# Patient Record
Sex: Female | Born: 1980 | Race: Black or African American | Hispanic: No | Marital: Married | Smoking: Never smoker
Health system: Southern US, Community
[De-identification: ages and names within clinical notes are randomized; demographics above are authoritative.]

## PROBLEM LIST (undated history)

## (undated) ENCOUNTER — Inpatient Hospital Stay (HOSPITAL_COMMUNITY): Payer: Self-pay

## (undated) DIAGNOSIS — R011 Cardiac murmur, unspecified: Secondary | ICD-10-CM

## (undated) DIAGNOSIS — S42309A Unspecified fracture of shaft of humerus, unspecified arm, initial encounter for closed fracture: Secondary | ICD-10-CM

## (undated) DIAGNOSIS — B019 Varicella without complication: Secondary | ICD-10-CM

## (undated) DIAGNOSIS — N83209 Unspecified ovarian cyst, unspecified side: Secondary | ICD-10-CM

## (undated) DIAGNOSIS — D649 Anemia, unspecified: Secondary | ICD-10-CM

## (undated) HISTORY — PX: OTHER SURGICAL HISTORY: SHX169

## (undated) HISTORY — DX: Varicella without complication: B01.9

## (undated) HISTORY — DX: Cardiac murmur, unspecified: R01.1

---

## 2005-02-03 ENCOUNTER — Ambulatory Visit: Payer: Self-pay | Admitting: Family Medicine

## 2005-02-21 ENCOUNTER — Other Ambulatory Visit: Admission: RE | Admit: 2005-02-21 | Discharge: 2005-02-21 | Payer: Self-pay | Admitting: Family Medicine

## 2005-02-21 ENCOUNTER — Ambulatory Visit: Payer: Self-pay | Admitting: Family Medicine

## 2006-07-09 ENCOUNTER — Ambulatory Visit (HOSPITAL_COMMUNITY): Admission: RE | Admit: 2006-07-09 | Discharge: 2006-07-09 | Payer: Self-pay | Admitting: Obstetrics & Gynecology

## 2006-10-28 ENCOUNTER — Ambulatory Visit (HOSPITAL_COMMUNITY): Admission: RE | Admit: 2006-10-28 | Discharge: 2006-10-28 | Payer: Self-pay | Admitting: Obstetrics

## 2006-12-06 ENCOUNTER — Inpatient Hospital Stay (HOSPITAL_COMMUNITY): Admission: AD | Admit: 2006-12-06 | Discharge: 2006-12-09 | Payer: Self-pay | Admitting: Obstetrics

## 2010-02-08 ENCOUNTER — Ambulatory Visit (HOSPITAL_COMMUNITY): Admission: RE | Admit: 2010-02-08 | Discharge: 2010-02-08 | Payer: Self-pay | Admitting: Obstetrics

## 2011-04-18 NOTE — H&P (Signed)
[name], [name]             ACCOUNT NO.:  [id number]   MEDICAL RECORD NO.:  [id number]          PATIENT TYPE:  INP   LOCATION:  9164                          FACILITY:  [facility]   PHYSICIAN:  Roseanna Rainbow, M.D.DATE OF BIRTH:  July 03, 1981   DATE OF ADMISSION:  12/06/2006  DATE OF DISCHARGE:                              HISTORY & PHYSICAL   CHIEF COMPLAINT:  The patient is a 30 year old gravida 1, para 0 with an  estimated date of confinement of December 02, 2006 with an intrauterine  pregnancy at 40+ weeks for induction of labor.   HISTORY OF PRESENT ILLNESS:  Please see the above.   ALLERGIES:  No known drug allergies.   MEDICATIONS:  Please see the reconciliation sheet.   RISK FACTORS:  1. GBS positive.  2. Rh negative   LABS:  Chlamydia probe negative. Urine culture and sensitivity - no uro  pathogens. One hour GCT 67. GC probe negative. Pap smear normal.  Hepatitis B. Surface antigen negative. Hematocrit 37. Hemoglobin 12.2,  HIV nonreactive. Platelets 223,000. Blood type is O negative, antibody  screen negative. RPR nonreactive. Rubella immune. Sickle cell negative.   PAST GYN HISTORY:  She has a history of an ovarian cystectomy.   PAST MEDICAL HISTORY:  Urinary tract infections.   PAST SURGICAL HISTORY:  Please see the above.   SOCIAL HISTORY:  She is a Child psychotherapist, single. Does not give any  significant history of alcohol usage. Has no significant smoking  history. Denies illicit drug use.   FAMILY HISTORY:  Noncontributory.   PHYSICAL EXAMINATION:  VITALS: Vital signs stable. Afebrile. Fetal heart  tracing reassuring.  GENERAL: No apparent distress. Well-developed.  ABDOMEN: Gravid, nontender.  GENITOURINARY: Cervix 1 in 50% as per the RN.   ASSESSMENT:  Intrauterine pregnancy at 40 weeks. Borderline Bishop  score. Fetal heart tracing consistent with fetal well-being. Rh  negative. GBS positive.   PLAN:  Admission.  Two stage induction of labor.  Will begin with  cervical ripening with Cervidil. GBS prophylaxis with penicillin per  protocol in labor.      Roseanna Rainbow, M.D.  Electronically Signed     LAJ/MEDQ  D:  12/07/2006  T:  12/07/2006  Job:  [id number]

## 2012-12-07 LAB — US OB COMP LESS 14 WKS

## 2012-12-08 LAB — OB RESULTS CONSOLE GC/CHLAMYDIA: Chlamydia: NEGATIVE

## 2012-12-08 LAB — OB RESULTS CONSOLE RUBELLA ANTIBODY, IGM: Rubella: IMMUNE

## 2012-12-08 LAB — OB RESULTS CONSOLE ABO/RH

## 2012-12-08 LAB — OB RESULTS CONSOLE HIV ANTIBODY (ROUTINE TESTING): HIV: NONREACTIVE

## 2012-12-08 LAB — OB RESULTS CONSOLE HGB/HCT, BLOOD: Hemoglobin: 12.6 g/dL

## 2012-12-08 LAB — OB RESULTS CONSOLE ANTIBODY SCREEN: Antibody Screen: NEGATIVE

## 2012-12-10 MED ORDER — RHO D IMMUNE GLOBULIN 1500 UNIT/2ML IJ SOLN: 300.0000 ug | Freq: Once | INTRAMUSCULAR | Status: AC

## 2013-01-21 ENCOUNTER — Encounter: Payer: Self-pay | Admitting: *Deleted

## 2013-02-15 ENCOUNTER — Ambulatory Visit: Payer: BC Managed Care – PPO | Admitting: *Deleted

## 2013-02-15 ENCOUNTER — Encounter: Payer: Self-pay | Admitting: Obstetrics

## 2013-02-15 ENCOUNTER — Ambulatory Visit (INDEPENDENT_AMBULATORY_CARE_PROVIDER_SITE_OTHER): Payer: BC Managed Care – PPO | Admitting: Obstetrics

## 2013-02-15 VITALS — BP 128/69 | Temp 97.0°F | Wt 243.0 lb

## 2013-02-15 DIAGNOSIS — Z3482 Encounter for supervision of other normal pregnancy, second trimester: Secondary | ICD-10-CM

## 2013-02-15 DIAGNOSIS — Z3480 Encounter for supervision of other normal pregnancy, unspecified trimester: Secondary | ICD-10-CM

## 2013-02-15 LAB — CBC
MCH: 27.5 pg (ref 26.0–34.0)
MCHC: 33.4 g/dL (ref 30.0–36.0)
MCV: 82.4 fL (ref 78.0–100.0)
RBC: 4.25 MIL/uL (ref 3.87–5.11)
RDW: 14.7 % (ref 11.5–15.5)
WBC: 12.3 10*3/uL — ABNORMAL HIGH (ref 4.0–10.5)

## 2013-02-15 LAB — POCT URINALYSIS DIPSTICK
Blood, UA: NEGATIVE
Glucose, UA: NEGATIVE
Spec Grav, UA: 1.01
Urobilinogen, UA: NEGATIVE
pH, UA: 8

## 2013-02-15 NOTE — Progress Notes (Signed)
Pt c/o intermittent lower back pain x 1 month (dull/achy). Pt c/o itchy rash on left side x 1 month.

## 2013-02-16 LAB — GLUCOSE TOLERANCE, 2 HOURS W/ 1HR: Glucose, 2 hour: 79 mg/dL (ref 70–139)

## 2013-03-16 ENCOUNTER — Ambulatory Visit (INDEPENDENT_AMBULATORY_CARE_PROVIDER_SITE_OTHER): Payer: BC Managed Care – PPO | Admitting: Obstetrics

## 2013-03-16 ENCOUNTER — Encounter: Payer: Self-pay | Admitting: Obstetrics

## 2013-03-16 VITALS — BP 120/77 | Temp 97.7°F | Wt 249.0 lb

## 2013-03-16 DIAGNOSIS — Z3480 Encounter for supervision of other normal pregnancy, unspecified trimester: Secondary | ICD-10-CM

## 2013-03-16 DIAGNOSIS — Z3483 Encounter for supervision of other normal pregnancy, third trimester: Secondary | ICD-10-CM

## 2013-03-16 DIAGNOSIS — K219 Gastro-esophageal reflux disease without esophagitis: Secondary | ICD-10-CM

## 2013-03-16 DIAGNOSIS — Z369 Encounter for antenatal screening, unspecified: Secondary | ICD-10-CM

## 2013-03-16 LAB — POCT URINALYSIS DIPSTICK
Glucose, UA: NEGATIVE
pH, UA: 5

## 2013-03-16 MED ORDER — OMEPRAZOLE 20 MG PO CPDR: 20.0000 mg | DELAYED_RELEASE_CAPSULE | Freq: Every day | ORAL | Status: DC

## 2013-03-16 NOTE — Progress Notes (Signed)
P 76 Pt reports she is doing well- pt states she quit using her PNV due to constipation.

## 2013-03-29 ENCOUNTER — Encounter: Payer: Self-pay | Admitting: Obstetrics

## 2013-03-29 ENCOUNTER — Ambulatory Visit (INDEPENDENT_AMBULATORY_CARE_PROVIDER_SITE_OTHER): Payer: BC Managed Care – PPO | Admitting: Obstetrics

## 2013-03-29 VITALS — BP 133/83 | Temp 97.5°F | Wt 249.4 lb

## 2013-03-29 DIAGNOSIS — Z2913 Encounter for prophylactic Rho(D) immune globulin: Secondary | ICD-10-CM

## 2013-03-29 DIAGNOSIS — Z3480 Encounter for supervision of other normal pregnancy, unspecified trimester: Secondary | ICD-10-CM

## 2013-03-29 DIAGNOSIS — Z298 Encounter for other specified prophylactic measures: Secondary | ICD-10-CM

## 2013-03-29 LAB — POCT URINALYSIS DIPSTICK
Bilirubin, UA: NEGATIVE
Glucose, UA: NEGATIVE
Ketones, UA: NEGATIVE
Protein, UA: NEGATIVE
Spec Grav, UA: 1.015
Urobilinogen, UA: NEGATIVE
pH, UA: 5

## 2013-03-29 MED ORDER — RHO D IMMUNE GLOBULIN 1500 UNIT/2ML IJ SOLN
300.0000 ug | Freq: Once | INTRAMUSCULAR | Status: AC
  Administered 2013-03-29: 300 ug via INTRAMUSCULAR

## 2013-03-29 NOTE — Progress Notes (Signed)
Pulse 75 patient c/o of intermittent bilateral abdominal pain.

## 2013-03-31 ENCOUNTER — Encounter: Payer: Self-pay | Admitting: Obstetrics

## 2013-04-06 ENCOUNTER — Encounter: Payer: Self-pay | Admitting: Obstetrics

## 2013-04-06 ENCOUNTER — Ambulatory Visit (INDEPENDENT_AMBULATORY_CARE_PROVIDER_SITE_OTHER): Payer: BC Managed Care – PPO

## 2013-04-06 DIAGNOSIS — Z369 Encounter for antenatal screening, unspecified: Secondary | ICD-10-CM

## 2013-04-06 DIAGNOSIS — O3660X0 Maternal care for excessive fetal growth, unspecified trimester, not applicable or unspecified: Secondary | ICD-10-CM

## 2013-04-06 LAB — US OB DETAIL + 14 WK

## 2013-04-07 ENCOUNTER — Encounter: Payer: Self-pay | Admitting: Obstetrics

## 2013-04-07 LAB — US OB DETAIL + 14 WK

## 2013-04-11 ENCOUNTER — Encounter: Payer: Self-pay | Admitting: *Deleted

## 2013-04-11 ENCOUNTER — Ambulatory Visit (INDEPENDENT_AMBULATORY_CARE_PROVIDER_SITE_OTHER): Payer: BC Managed Care – PPO | Admitting: Obstetrics

## 2013-04-11 VITALS — BP 127/81 | Temp 98.0°F | Wt 253.0 lb

## 2013-04-11 DIAGNOSIS — Z3480 Encounter for supervision of other normal pregnancy, unspecified trimester: Secondary | ICD-10-CM

## 2013-04-11 DIAGNOSIS — Z3483 Encounter for supervision of other normal pregnancy, third trimester: Secondary | ICD-10-CM

## 2013-04-11 LAB — POCT URINALYSIS DIPSTICK
Bilirubin, UA: NEGATIVE
Glucose, UA: NEGATIVE
Ketones, UA: NEGATIVE
Spec Grav, UA: 1.02

## 2013-04-11 NOTE — Progress Notes (Signed)
Pulse: 73

## 2013-04-12 ENCOUNTER — Encounter: Payer: Self-pay | Admitting: Obstetrics

## 2013-04-26 ENCOUNTER — Ambulatory Visit (INDEPENDENT_AMBULATORY_CARE_PROVIDER_SITE_OTHER): Payer: BC Managed Care – PPO | Admitting: Obstetrics

## 2013-04-26 VITALS — BP 134/82 | Temp 96.7°F | Wt 254.0 lb

## 2013-04-26 DIAGNOSIS — Z3480 Encounter for supervision of other normal pregnancy, unspecified trimester: Secondary | ICD-10-CM

## 2013-04-26 DIAGNOSIS — Z8719 Personal history of other diseases of the digestive system: Secondary | ICD-10-CM

## 2013-04-26 DIAGNOSIS — Z3482 Encounter for supervision of other normal pregnancy, second trimester: Secondary | ICD-10-CM

## 2013-04-26 LAB — POCT URINALYSIS DIPSTICK
Bilirubin, UA: NEGATIVE
Blood, UA: NEGATIVE
Glucose, UA: NEGATIVE
Urobilinogen, UA: NEGATIVE
pH, UA: 5

## 2013-04-26 MED ORDER — RANITIDINE HCL 150 MG PO TABS: 150.0000 mg | ORAL_TABLET | Freq: Two times a day (BID) | ORAL | Status: DC

## 2013-04-26 NOTE — Progress Notes (Signed)
Pulse: 78

## 2013-04-27 ENCOUNTER — Encounter: Payer: Self-pay | Admitting: Obstetrics

## 2013-05-06 ENCOUNTER — Inpatient Hospital Stay (HOSPITAL_COMMUNITY)
Admission: AD | Admit: 2013-05-06 | Discharge: 2013-05-06 | Disposition: A | Discharge status: Home / Self Care | Payer: Federal, State, Local not specified - PPO | Source: Ambulatory Visit | Attending: Obstetrics & Gynecology | Admitting: Obstetrics & Gynecology

## 2013-05-06 ENCOUNTER — Encounter (HOSPITAL_COMMUNITY): Payer: Self-pay | Admitting: *Deleted

## 2013-05-06 DIAGNOSIS — R109 Unspecified abdominal pain: Secondary | ICD-10-CM | POA: Insufficient documentation

## 2013-05-06 DIAGNOSIS — N949 Unspecified condition associated with female genital organs and menstrual cycle: Secondary | ICD-10-CM | POA: Insufficient documentation

## 2013-05-06 DIAGNOSIS — O26893 Other specified pregnancy related conditions, third trimester: Secondary | ICD-10-CM

## 2013-05-06 DIAGNOSIS — O26859 Spotting complicating pregnancy, unspecified trimester: Secondary | ICD-10-CM | POA: Insufficient documentation

## 2013-05-06 DIAGNOSIS — N888 Other specified noninflammatory disorders of cervix uteri: Secondary | ICD-10-CM

## 2013-05-06 DIAGNOSIS — N898 Other specified noninflammatory disorders of vagina: Secondary | ICD-10-CM

## 2013-05-06 HISTORY — DX: Unspecified fracture of shaft of humerus, unspecified arm, initial encounter for closed fracture: S42.309A

## 2013-05-06 HISTORY — DX: Unspecified ovarian cyst, unspecified side: N83.209

## 2013-05-06 LAB — WET PREP, GENITAL
Trich, Wet Prep: NONE SEEN
Yeast Wet Prep HPF POC: NONE SEEN

## 2013-05-06 NOTE — MAU Note (Signed)
Patient states she had dark pink spotting on the tissue after urinating this am and had a slight darker spotting this afternoon. No bleeding at this time and nothing on the pad, only with wiping. Denies pain and reports good fetal movement.

## 2013-05-06 NOTE — MAU Note (Addendum)
Pinkish blood noted when wiped this morning, dark red when saw it later, none since. Denies hx of low lying or placenta previa.

## 2013-05-06 NOTE — MAU Provider Note (Signed)
History     CSN: [id number]  Arrival date and time: 05/06/13 1540   First Provider Initiated Contact with Patient 05/06/13 1637      Chief Complaint  Patient presents with  . Vaginal Bleeding   HPI [name] is a 32 y.o. G2P1001 at [redacted]w[redacted]d who presents to MAU today with complaint of spotting with wiping x 2 today. The patient denies contractions, LOF, abdominal pain, UTI symptoms, fever, N/V/D or constipation. She denies recent intercourse or exam. She endorses a yellow-clear thin vaginal discharge without odor. She reports good fetal movement.   OB History   Grav Para Term Preterm Abortions TAB SAB Ect Mult Living   2 1 1       1       Past Medical History  Diagnosis Date  . Heart murmur   . Chicken pox chicken pox  . Ovarian cyst   . Fracture of arm     right    Past Surgical History  Procedure Laterality Date  . Ovarian cystectomy N/A     Family History  Problem Relation Age of Onset  . Hypertension      parents  . Cancer Neg Hx   . Stroke Neg Hx   . Diabetes Paternal Grandmother   . Hypertension Father   . Heart disease Father     History  Substance Use Topics  . Smoking status: Never Smoker   . Smokeless tobacco: Never Used  . Alcohol Use: No    Allergies: No Known Allergies  Prescriptions prior to admission  Medication Sig Dispense Refill  . ranitidine (ZANTAC) 150 MG tablet Take 1 tablet (150 mg total) by mouth 2 (two) times daily.  60 tablet  11    Review of Systems  Constitutional: Negative for fever and malaise/fatigue.  Gastrointestinal: Positive for nausea. Negative for vomiting, abdominal pain, diarrhea and constipation.  Genitourinary: Negative for dysuria, urgency and frequency.       + discharge, bleeding Neg - LOF   Physical Exam   Blood pressure 133/70, pulse 114, temperature 98.2 F (36.8 C), temperature source Oral, resp. rate 20, height 5\' 10"  (1.778 m), weight 255 lb 12.8 oz (116.03 kg), last menstrual period  09/11/2012, SpO2 100.00%.  Physical Exam  Constitutional: She is oriented to person, place, and time. She appears well-developed and well-nourished. No distress.  HENT:  Head: Normocephalic and atraumatic.  Cardiovascular: Normal rate, regular rhythm and normal heart sounds.   Respiratory: Effort normal and breath sounds normal. No respiratory distress.  GI: Soft. Bowel sounds are normal. She exhibits no distension and no mass. There is no tenderness. There is no rebound and no guarding.  Genitourinary: Uterus is enlarged (appropriate for GA). Uterus is not tender. Cervix exhibits friability (small area of bleeding on the cervix). Cervix exhibits no motion tenderness and no discharge. Vaginal discharge (small amount of thin, white, mucus discharge) found.  Neurological: She is alert and oriented to person, place, and time.  Skin: Skin is warm and dry. No erythema.  Psychiatric: She has a normal mood and affect.   Fetal monitoring: Baseline: 130 bpm, moderate variability, + accelerations, no decelerations Contractions: none  MAU Course  Procedures  MDM Cervix is closed Bleeding noted in area of friability on cervix, none at cervical os  Assessment and Plan  A: Cervical friability  P: Discharge home Patient encouraged to follow-up in office as scheduled Bleeding and labor precautions reviewed Patient may return to MAU as needed  [name]  Ethier, PA-C  05/06/2013, 4:37 PM

## 2013-05-07 LAB — GC/CHLAMYDIA PROBE AMP
CT Probe RNA: NEGATIVE
GC Probe RNA: NEGATIVE

## 2013-05-10 ENCOUNTER — Encounter: Payer: Self-pay | Admitting: Obstetrics

## 2013-05-10 ENCOUNTER — Ambulatory Visit (INDEPENDENT_AMBULATORY_CARE_PROVIDER_SITE_OTHER): Payer: BC Managed Care – PPO | Admitting: Obstetrics

## 2013-05-10 VITALS — BP 126/83 | Temp 98.0°F | Wt 255.4 lb

## 2013-05-10 DIAGNOSIS — Z3483 Encounter for supervision of other normal pregnancy, third trimester: Secondary | ICD-10-CM

## 2013-05-10 DIAGNOSIS — Z3480 Encounter for supervision of other normal pregnancy, unspecified trimester: Secondary | ICD-10-CM

## 2013-05-10 LAB — POCT URINALYSIS DIPSTICK
Ketones, UA: NEGATIVE
Nitrite, UA: NEGATIVE
Spec Grav, UA: 1.01
Urobilinogen, UA: NEGATIVE
pH, UA: 7.5

## 2013-05-10 NOTE — Progress Notes (Signed)
Pulse: 91

## 2013-05-10 NOTE — Addendum Note (Signed)
Addended by: George Hugh on: 05/10/2013 05:18 PM   Modules accepted: Orders

## 2013-05-12 LAB — STREP B DNA PROBE: GBSP: POSITIVE

## 2013-05-17 ENCOUNTER — Ambulatory Visit (INDEPENDENT_AMBULATORY_CARE_PROVIDER_SITE_OTHER): Payer: BC Managed Care – PPO | Admitting: Obstetrics

## 2013-05-17 ENCOUNTER — Encounter: Payer: Self-pay | Admitting: Obstetrics

## 2013-05-17 ENCOUNTER — Encounter: Payer: Self-pay | Admitting: *Deleted

## 2013-05-17 VITALS — BP 140/76 | Temp 97.8°F | Wt 254.8 lb

## 2013-05-17 DIAGNOSIS — Z3483 Encounter for supervision of other normal pregnancy, third trimester: Secondary | ICD-10-CM

## 2013-05-17 DIAGNOSIS — Z3480 Encounter for supervision of other normal pregnancy, unspecified trimester: Secondary | ICD-10-CM

## 2013-05-17 LAB — POCT URINALYSIS DIPSTICK
Bilirubin, UA: NEGATIVE
Ketones, UA: NEGATIVE
Protein, UA: NEGATIVE
pH, UA: 7

## 2013-05-17 NOTE — Progress Notes (Signed)
Pulse-84 Pt c/o left side abdominal cramping once x 2 days ago. Pt denies having that feeling since.

## 2013-05-24 ENCOUNTER — Ambulatory Visit (INDEPENDENT_AMBULATORY_CARE_PROVIDER_SITE_OTHER): Payer: BC Managed Care – PPO | Admitting: Obstetrics

## 2013-05-24 ENCOUNTER — Encounter: Payer: Self-pay | Admitting: Obstetrics

## 2013-05-24 VITALS — BP 137/83 | Temp 97.2°F | Wt 256.0 lb

## 2013-05-24 DIAGNOSIS — Z3483 Encounter for supervision of other normal pregnancy, third trimester: Secondary | ICD-10-CM

## 2013-05-24 DIAGNOSIS — Z3480 Encounter for supervision of other normal pregnancy, unspecified trimester: Secondary | ICD-10-CM

## 2013-05-24 LAB — POCT URINALYSIS DIPSTICK
Blood, UA: NEGATIVE
Glucose, UA: NEGATIVE
Ketones, UA: NEGATIVE
Nitrite, UA: NEGATIVE
Spec Grav, UA: 1.02
Urobilinogen, UA: NEGATIVE

## 2013-05-24 NOTE — Progress Notes (Signed)
Pulse-75 No complaints.  

## 2013-05-31 ENCOUNTER — Ambulatory Visit (INDEPENDENT_AMBULATORY_CARE_PROVIDER_SITE_OTHER): Payer: BC Managed Care – PPO | Admitting: Obstetrics

## 2013-05-31 ENCOUNTER — Encounter: Payer: Self-pay | Admitting: Obstetrics

## 2013-05-31 VITALS — BP 129/80 | Temp 98.6°F | Wt 257.6 lb

## 2013-05-31 DIAGNOSIS — Z3483 Encounter for supervision of other normal pregnancy, third trimester: Secondary | ICD-10-CM

## 2013-05-31 DIAGNOSIS — Z3480 Encounter for supervision of other normal pregnancy, unspecified trimester: Secondary | ICD-10-CM

## 2013-05-31 LAB — POCT URINALYSIS DIPSTICK: Ketones, UA: NEGATIVE

## 2013-05-31 NOTE — Progress Notes (Signed)
Pulse: 71

## 2013-06-07 ENCOUNTER — Encounter: Payer: Self-pay | Admitting: Obstetrics

## 2013-06-07 ENCOUNTER — Ambulatory Visit (INDEPENDENT_AMBULATORY_CARE_PROVIDER_SITE_OTHER): Payer: BC Managed Care – PPO | Admitting: Obstetrics

## 2013-06-07 VITALS — BP 111/75 | Wt 258.0 lb

## 2013-06-07 DIAGNOSIS — Z3480 Encounter for supervision of other normal pregnancy, unspecified trimester: Secondary | ICD-10-CM

## 2013-06-07 DIAGNOSIS — Z3483 Encounter for supervision of other normal pregnancy, third trimester: Secondary | ICD-10-CM

## 2013-06-07 LAB — POCT URINALYSIS DIPSTICK
Blood, UA: NEGATIVE
Glucose, UA: NEGATIVE
Ketones, UA: NEGATIVE
Protein, UA: NEGATIVE
Spec Grav, UA: 1.015
Urobilinogen, UA: NEGATIVE
pH, UA: 6

## 2013-06-07 NOTE — Progress Notes (Signed)
Pulse- 103 Pt states she is having lower abdomen pressure

## 2013-06-14 ENCOUNTER — Inpatient Hospital Stay (HOSPITAL_COMMUNITY)
Admission: RE | Admit: 2013-06-14 | Discharge: 2013-06-19 | DRG: 370 | Disposition: A | Discharge status: Home / Self Care | Payer: Federal, State, Local not specified - PPO | Source: Ambulatory Visit | Attending: Obstetrics | Admitting: Obstetrics

## 2013-06-14 ENCOUNTER — Encounter (HOSPITAL_COMMUNITY): Payer: Self-pay | Admitting: *Deleted

## 2013-06-14 ENCOUNTER — Telehealth (HOSPITAL_COMMUNITY): Payer: Self-pay | Admitting: *Deleted

## 2013-06-14 ENCOUNTER — Encounter (HOSPITAL_COMMUNITY): Payer: Self-pay

## 2013-06-14 ENCOUNTER — Ambulatory Visit (INDEPENDENT_AMBULATORY_CARE_PROVIDER_SITE_OTHER): Payer: BC Managed Care – PPO | Admitting: Obstetrics

## 2013-06-14 VITALS — BP 123/78 | HR 92 | Temp 97.9°F | Resp 18 | Ht 70.0 in | Wt 258.0 lb

## 2013-06-14 VITALS — BP 132/79 | Temp 98.2°F | Wt 258.6 lb

## 2013-06-14 DIAGNOSIS — Z302 Encounter for sterilization: Secondary | ICD-10-CM

## 2013-06-14 DIAGNOSIS — Z3483 Encounter for supervision of other normal pregnancy, third trimester: Secondary | ICD-10-CM

## 2013-06-14 DIAGNOSIS — Z98891 History of uterine scar from previous surgery: Secondary | ICD-10-CM

## 2013-06-14 DIAGNOSIS — D689 Coagulation defect, unspecified: Secondary | ICD-10-CM | POA: Diagnosis present

## 2013-06-14 DIAGNOSIS — O9912 Other diseases of the blood and blood-forming organs and certain disorders involving the immune mechanism complicating childbirth: Secondary | ICD-10-CM | POA: Diagnosis present

## 2013-06-14 DIAGNOSIS — D649 Anemia, unspecified: Secondary | ICD-10-CM | POA: Diagnosis not present

## 2013-06-14 DIAGNOSIS — Z22330 Carrier of Group B streptococcus: Secondary | ICD-10-CM

## 2013-06-14 DIAGNOSIS — O480 Post-term pregnancy: Secondary | ICD-10-CM

## 2013-06-14 DIAGNOSIS — Z3480 Encounter for supervision of other normal pregnancy, unspecified trimester: Secondary | ICD-10-CM

## 2013-06-14 DIAGNOSIS — O99892 Other specified diseases and conditions complicating childbirth: Secondary | ICD-10-CM | POA: Diagnosis present

## 2013-06-14 DIAGNOSIS — O9903 Anemia complicating the puerperium: Secondary | ICD-10-CM | POA: Diagnosis not present

## 2013-06-14 DIAGNOSIS — D696 Thrombocytopenia, unspecified: Secondary | ICD-10-CM | POA: Diagnosis present

## 2013-06-14 LAB — CBC
HCT: 32.9 % — ABNORMAL LOW (ref 36.0–46.0)
MCV: 83.7 fL (ref 78.0–100.0)
Platelets: 131 10*3/uL — ABNORMAL LOW (ref 150–400)
RBC: 3.93 MIL/uL (ref 3.87–5.11)
RDW: 14.5 % (ref 11.5–15.5)
WBC: 15.1 10*3/uL — ABNORMAL HIGH (ref 4.0–10.5)

## 2013-06-14 LAB — POCT URINALYSIS DIPSTICK
Bilirubin, UA: NEGATIVE
Glucose, UA: NEGATIVE
Leukocytes, UA: NEGATIVE
Nitrite, UA: NEGATIVE
Urobilinogen, UA: NEGATIVE

## 2013-06-14 LAB — TYPE AND SCREEN
ABO/RH(D): O NEG
Antibody Screen: NEGATIVE

## 2013-06-14 MED ORDER — PENICILLIN G POTASSIUM 5000000 UNITS IJ SOLR
2.5000 10*6.[IU] | INTRAVENOUS | Status: DC
  Administered 2013-06-15 – 2013-06-16 (×9): 2.5 10*6.[IU] via INTRAVENOUS
  Filled 2013-06-14 (×13): qty 2.5

## 2013-06-14 MED ORDER — LACTATED RINGERS IV SOLN: 500.0000 mL | INTRAVENOUS | Status: DC | PRN

## 2013-06-14 MED ORDER — MISOPROSTOL 25 MCG QUARTER TABLET
25.0000 ug | ORAL_TABLET | ORAL | Status: DC | PRN
  Administered 2013-06-14 – 2013-06-15 (×2): 25 ug via VAGINAL
  Filled 2013-06-14 (×2): qty 0.25

## 2013-06-14 MED ORDER — CITRIC ACID-SODIUM CITRATE 334-500 MG/5ML PO SOLN
30.0000 mL | ORAL | Status: DC | PRN
  Administered 2013-06-16: 30 mL via ORAL
  Filled 2013-06-14: qty 15

## 2013-06-14 MED ORDER — OXYCODONE-ACETAMINOPHEN 5-325 MG PO TABS: 1.0000 | ORAL_TABLET | ORAL | Status: DC | PRN

## 2013-06-14 MED ORDER — OXYTOCIN 40 UNITS IN LACTATED RINGERS INFUSION - SIMPLE MED: 62.5000 mL/h | INTRAVENOUS | Status: DC

## 2013-06-14 MED ORDER — LIDOCAINE HCL (PF) 1 % IJ SOLN: 30.0000 mL | INTRAMUSCULAR | Status: DC | PRN

## 2013-06-14 MED ORDER — OXYTOCIN BOLUS FROM INFUSION: 500.0000 mL | INTRAVENOUS | Status: DC

## 2013-06-14 MED ORDER — PENICILLIN G POTASSIUM 5000000 UNITS IJ SOLR
5.0000 10*6.[IU] | Freq: Once | INTRAVENOUS | Status: AC
  Administered 2013-06-15: 5 10*6.[IU] via INTRAVENOUS
  Filled 2013-06-14: qty 5

## 2013-06-14 MED ORDER — ACETAMINOPHEN 325 MG PO TABS: 650.0000 mg | ORAL_TABLET | ORAL | Status: DC | PRN

## 2013-06-14 MED ORDER — NALBUPHINE HCL 10 MG/ML IJ SOLN
10.0000 mg | Freq: Four times a day (QID) | INTRAMUSCULAR | Status: DC | PRN
  Administered 2013-06-15: 10 mg via INTRAMUSCULAR
  Filled 2013-06-14: qty 1

## 2013-06-14 MED ORDER — ONDANSETRON HCL 4 MG/2ML IJ SOLN: 4.0000 mg | Freq: Four times a day (QID) | INTRAMUSCULAR | Status: DC | PRN

## 2013-06-14 MED ORDER — IBUPROFEN 600 MG PO TABS: 600.0000 mg | ORAL_TABLET | Freq: Four times a day (QID) | ORAL | Status: DC | PRN

## 2013-06-14 MED ORDER — NALBUPHINE SYRINGE 5 MG/0.5 ML
10.0000 mg | INJECTION | INTRAMUSCULAR | Status: DC | PRN
  Administered 2013-06-15 (×2): 10 mg via INTRAVENOUS
  Filled 2013-06-14 (×3): qty 1

## 2013-06-14 MED ORDER — LACTATED RINGERS IV SOLN
INTRAVENOUS | Status: DC
  Administered 2013-06-14 – 2013-06-16 (×7): via INTRAVENOUS

## 2013-06-14 MED ORDER — PROMETHAZINE HCL 25 MG/ML IJ SOLN
25.0000 mg | Freq: Four times a day (QID) | INTRAMUSCULAR | Status: DC | PRN
  Administered 2013-06-15 (×2): 25 mg via INTRAMUSCULAR
  Filled 2013-06-14 (×2): qty 1

## 2013-06-14 MED ORDER — TERBUTALINE SULFATE 1 MG/ML IJ SOLN: 0.2500 mg | Freq: Once | INTRAMUSCULAR | Status: AC | PRN

## 2013-06-14 NOTE — Telephone Encounter (Signed)
Preadmission screen  

## 2013-06-14 NOTE — H&P (Signed)
Melanie [name] is a 32 y.o. female presenting for IOL. Maternal Medical History:  Reason for admission: 19 y o G2 P1.  EDC 06-13-13.  Presents for IOL for postdates.  Fetal activity: Perceived fetal activity is normal.   Last perceived fetal movement was within the past hour.    Prenatal complications: no prenatal complications   OB History   Grav Para Term Preterm Abortions TAB SAB Ect Mult Living   2 1 1       1      Past Medical History  Diagnosis Date  . Heart murmur   . Chicken pox chicken pox  . Ovarian cyst   . Fracture of arm     right   Past Surgical History  Procedure Laterality Date  . Ovarian cystectomy N/A    Family History: family history includes Diabetes in her paternal grandmother and Hypertension in her father.  There is no history of Cancer and Stroke. Social History:  reports that she has never smoked. She has never used smokeless tobacco. She reports that she does not drink alcohol or use illicit drugs.   Prenatal Transfer Tool  Maternal Diabetes: No Genetic Screening: Normal Maternal Ultrasounds/Referrals: Normal Fetal Ultrasounds or other Referrals:  None Maternal Substance Abuse:  No Significant Maternal Medications:  None Significant Maternal Lab Results:  None Other Comments:  None  Review of Systems  All other systems reviewed and are negative.      Blood pressure 127/74, pulse 87, temperature 97.9 F (36.6 C), temperature source Oral, resp. rate 20, height 5\' 10"  (1.778 m), weight 258 lb (117.028 kg), last menstrual period 09/11/2012. Maternal Exam:  Abdomen: Patient reports no abdominal tenderness. Fetal presentation: vertex  Pelvis: adequate for delivery.   Cervix: Cervix evaluated by digital exam.     Physical Exam  Nursing note and vitals reviewed. Constitutional: She is oriented to person, place, and time. She appears well-developed and well-nourished.  HENT:  Head: Normocephalic and atraumatic.  Eyes: Conjunctivae are  normal. Pupils are equal, round, and reactive to light.  Neck: Normal range of motion. Neck supple.  Cardiovascular: Normal rate and regular rhythm.   Respiratory: Effort normal and breath sounds normal.  GI: Soft.  Genitourinary: Vagina normal and uterus normal.  Musculoskeletal: Normal range of motion.  Neurological: She is alert and oriented to person, place, and time.  Skin: Skin is warm and dry.  Psychiatric: She has a normal mood and affect. Her behavior is normal. Judgment and thought content normal.    Prenatal labs: ABO, Rh: O/Negative/-- (01/08 0000) Antibody: Negative (01/08 0000) Rubella: Immune (01/08 0000) RPR: NON REAC (03/18 0948)  HBsAg: Negative (01/08 0000)  HIV: NON REACTIVE (03/18 0948)  GBS: POSITIVE (06/10 1720)   Assessment/Plan: Postdates.  2 stage IOL.   Sharnell Knight A 06/14/2013, 8:30 PM

## 2013-06-14 NOTE — Progress Notes (Signed)
Pulse: 90

## 2013-06-15 ENCOUNTER — Inpatient Hospital Stay (HOSPITAL_COMMUNITY): Payer: Federal, State, Local not specified - PPO | Admitting: Anesthesiology

## 2013-06-15 ENCOUNTER — Encounter (HOSPITAL_COMMUNITY): Payer: Self-pay | Admitting: Anesthesiology

## 2013-06-15 LAB — CBC
HCT: 33.7 % — ABNORMAL LOW (ref 36.0–46.0)
Hemoglobin: 10.8 g/dL — ABNORMAL LOW (ref 12.0–15.0)
MCHC: 32 g/dL (ref 30.0–36.0)
MCV: 84.7 fL (ref 78.0–100.0)

## 2013-06-15 MED ORDER — EPHEDRINE 5 MG/ML INJ: 10.0000 mg | INTRAVENOUS | Status: DC | PRN

## 2013-06-15 MED ORDER — MISOPROSTOL 25 MCG QUARTER TABLET
25.0000 ug | ORAL_TABLET | ORAL | Status: DC | PRN
  Administered 2013-06-15 – 2013-06-16 (×3): 25 ug via VAGINAL
  Filled 2013-06-15 (×3): qty 0.25

## 2013-06-15 MED ORDER — DIPHENHYDRAMINE HCL 50 MG/ML IJ SOLN: 12.5000 mg | INTRAMUSCULAR | Status: DC | PRN

## 2013-06-15 MED ORDER — TERBUTALINE SULFATE 1 MG/ML IJ SOLN: 0.2500 mg | Freq: Once | INTRAMUSCULAR | Status: AC | PRN

## 2013-06-15 MED ORDER — PHENYLEPHRINE 40 MCG/ML (10ML) SYRINGE FOR IV PUSH (FOR BLOOD PRESSURE SUPPORT): 80.0000 ug | PREFILLED_SYRINGE | INTRAVENOUS | Status: DC | PRN

## 2013-06-15 MED ORDER — LACTATED RINGERS IV SOLN: 500.0000 mL | Freq: Once | INTRAVENOUS | Status: DC

## 2013-06-15 MED ORDER — OXYTOCIN 40 UNITS IN LACTATED RINGERS INFUSION - SIMPLE MED
1.0000 m[IU]/min | INTRAVENOUS | Status: DC
  Administered 2013-06-16: 1 m[IU]/min via INTRAVENOUS
  Filled 2013-06-15: qty 1000

## 2013-06-15 MED ORDER — NALBUPHINE SYRINGE 5 MG/0.5 ML
10.0000 mg | INJECTION | Freq: Four times a day (QID) | INTRAMUSCULAR | Status: DC | PRN
  Administered 2013-06-15: 10 mg via INTRAMUSCULAR
  Filled 2013-06-15 (×2): qty 1

## 2013-06-15 MED ORDER — FENTANYL 2.5 MCG/ML BUPIVACAINE 1/10 % EPIDURAL INFUSION (WH - ANES): 14.0000 mL/h | INTRAMUSCULAR | Status: DC | PRN

## 2013-06-15 MED ORDER — OXYTOCIN 40 UNITS IN LACTATED RINGERS INFUSION - SIMPLE MED
1.0000 m[IU]/min | INTRAVENOUS | Status: DC
  Administered 2013-06-15: 1 m[IU]/min via INTRAVENOUS
  Administered 2013-06-15: 5 m[IU]/min via INTRAVENOUS
  Administered 2013-06-15: 4 m[IU]/min via INTRAVENOUS
  Filled 2013-06-15: qty 1000

## 2013-06-15 MED ORDER — LIDOCAINE HCL (PF) 1 % IJ SOLN
INTRAMUSCULAR | Status: DC | PRN
  Administered 2013-06-15: 3 mL
  Administered 2013-06-15: 5 mL

## 2013-06-15 NOTE — Anesthesia Procedure Notes (Signed)
Epidural Patient location during procedure: OB  Staffing Anesthesiologist: Phillips Grout Performed by: anesthesiologist   Preanesthetic Checklist Completed: patient identified, site marked, surgical consent, pre-op evaluation, timeout performed, IV checked, risks and benefits discussed and monitors and equipment checked  Epidural Patient position: sitting Prep: ChloraPrep Patient monitoring: heart rate, continuous pulse ox and blood pressure Approach: midline Injection technique: LOR saline  Needle:  Needle type: Tuohy  Needle gauge: 17 G Needle length: 9 cm and 9 Needle insertion depth: 9 cm Catheter type: closed end flexible Catheter size: 20 Guage Catheter at skin depth: 15 cm Test dose: negative  Assessment Events: blood not aspirated, injection not painful, no injection resistance, negative IV test and no paresthesia  Additional Notes   Patient tolerated the insertion well without complications.

## 2013-06-15 NOTE — Progress Notes (Signed)
Melanie [name] is a 32 y.o. G2P1001 at [redacted]w[redacted]d by LMP admitted for induction of labor due to Post dates. Due date 06-13-13.  Subjective:   Objective: BP 120/73  Pulse 79  Temp(Src) 98.1 F (36.7 C) (Oral)  Resp 18  Ht 5\' 10"  (1.778 m)  Wt 258 lb (117.028 kg)  BMI 37.02 kg/m2  LMP 09/11/2012      FHT:  FHR: 150 bpm, variability: moderate,  accelerations:  Present,  decelerations:  Absent UC:   regular, every 2-4 minutes SVE:   Dilation: 4 Effacement (%): 60 Station: -3 Exam by:: ansah-mensah, rnc  Labs: Lab Results  Component Value Date   WBC 13.1* 06/15/2013   HGB 10.8* 06/15/2013   HCT 33.7* 06/15/2013   MCV 84.7 06/15/2013   PLT 111* 06/15/2013    Assessment / Plan: IOL due to postdates.  No progress after 12 hours of pitocin.  Will stop pitocin and ripen cervix with Cytotec overnight. Thrombocytopenia.  Probable gestational.  Will place dry epidural for later use per recommendation of anesthesia.  Labor: Latent phase Preeclampsia:  n/a Fetal Wellbeing:  Category I Pain Control:  Nubain I/D:  n/a Anticipated MOD:  NSVD  HARPER,CHARLES A 06/15/2013, 8:01 PM

## 2013-06-15 NOTE — Anesthesia Preprocedure Evaluation (Signed)
Anesthesia Evaluation  Patient identified by MRN, date of birth, ID band Patient awake    Reviewed: Allergy & Precautions, H&P , NPO status , Patient's Chart, lab work & pertinent test results  History of Anesthesia Complications Negative for: history of anesthetic complications  Airway Mallampati: II TM Distance: >3 FB Neck ROM: full    Dental no notable dental hx. (+) Teeth Intact   Pulmonary neg pulmonary ROS,  breath sounds clear to auscultation  Pulmonary exam normal       Cardiovascular negative cardio ROS  Rhythm:regular Rate:Normal     Neuro/Psych negative neurological ROS  negative psych ROS   GI/Hepatic negative GI ROS, Neg liver ROS,   Endo/Other  negative endocrine ROS  Renal/GU negative Renal ROS  negative genitourinary   Musculoskeletal   Abdominal Normal abdominal exam  (+)   Peds  Hematology negative hematology ROS (+)   Anesthesia Other Findings   Reproductive/Obstetrics (+) Pregnancy                           Anesthesia Physical Anesthesia Plan  ASA: II  Anesthesia Plan: Epidural   Post-op Pain Management:    Induction:   Airway Management Planned:   Additional Equipment:   Intra-op Plan:   Post-operative Plan:   Informed Consent: I have reviewed the patients History and Physical, chart, labs and discussed the procedure including the risks, benefits and alternatives for the proposed anesthesia with the patient or authorized representative who has indicated his/her understanding and acceptance.     Plan Discussed with:   Anesthesia Plan Comments: (Asked to place dry catheter. PLT dropping. cytotec induction.)        Anesthesia Quick Evaluation

## 2013-06-16 ENCOUNTER — Encounter (HOSPITAL_COMMUNITY)
Admission: RE | Disposition: A | Discharge status: Home / Self Care | Payer: Self-pay | Source: Ambulatory Visit | Attending: Obstetrics

## 2013-06-16 ENCOUNTER — Encounter (HOSPITAL_COMMUNITY): Payer: Self-pay

## 2013-06-16 DIAGNOSIS — Z302 Encounter for sterilization: Secondary | ICD-10-CM

## 2013-06-16 LAB — CBC
HCT: 32.5 % — ABNORMAL LOW (ref 36.0–46.0)
HCT: 29.4 % — ABNORMAL LOW (ref 36.0–46.0)
Hemoglobin: 9.5 g/dL — ABNORMAL LOW (ref 12.0–15.0)
MCH: 27.4 pg (ref 26.0–34.0)
MCV: 84.9 fL (ref 78.0–100.0)
RBC: 3.5 MIL/uL — ABNORMAL LOW (ref 3.87–5.11)
RDW: 14.5 % (ref 11.5–15.5)
RDW: 14.5 % (ref 11.5–15.5)
WBC: 12.5 10*3/uL — ABNORMAL HIGH (ref 4.0–10.5)
WBC: 11 10*3/uL — ABNORMAL HIGH (ref 4.0–10.5)

## 2013-06-16 LAB — COMPREHENSIVE METABOLIC PANEL
Albumin: 2.3 g/dL — ABNORMAL LOW (ref 3.5–5.2)
BUN: 5 mg/dL — ABNORMAL LOW (ref 6–23)
CO2: 24 mEq/L (ref 19–32)
Calcium: 9 mg/dL (ref 8.4–10.5)
Chloride: 104 mEq/L (ref 96–112)
Creatinine, Ser: 0.89 mg/dL (ref 0.50–1.10)
GFR calc non Af Amer: 85 mL/min — ABNORMAL LOW (ref >90)
Total Bilirubin: 0.4 mg/dL (ref 0.3–1.2)

## 2013-06-16 SURGERY — Surgical Case
Anesthesia: Regional | Site: Abdomen | Wound class: Clean Contaminated

## 2013-06-16 MED ORDER — ACETAMINOPHEN 10 MG/ML IV SOLN: 1000.0000 mg | Freq: Once | INTRAVENOUS | Status: DC | PRN

## 2013-06-16 MED ORDER — MEPERIDINE HCL 25 MG/ML IJ SOLN
INTRAMUSCULAR | Status: AC
  Filled 2013-06-16: qty 1

## 2013-06-16 MED ORDER — KETOROLAC TROMETHAMINE 30 MG/ML IJ SOLN: 30.0000 mg | Freq: Four times a day (QID) | INTRAMUSCULAR | Status: DC | PRN

## 2013-06-16 MED ORDER — METOCLOPRAMIDE HCL 5 MG/ML IJ SOLN: 10.0000 mg | Freq: Three times a day (TID) | INTRAMUSCULAR | Status: DC | PRN

## 2013-06-16 MED ORDER — FENTANYL CITRATE 0.05 MG/ML IJ SOLN
INTRAMUSCULAR | Status: DC | PRN
  Administered 2013-06-16 (×4): 25 ug via INTRAVENOUS

## 2013-06-16 MED ORDER — ONDANSETRON HCL 4 MG/2ML IJ SOLN
INTRAMUSCULAR | Status: AC
  Filled 2013-06-16: qty 2

## 2013-06-16 MED ORDER — DIPHENHYDRAMINE HCL 50 MG/ML IJ SOLN: 25.0000 mg | INTRAMUSCULAR | Status: DC | PRN

## 2013-06-16 MED ORDER — ONDANSETRON HCL 4 MG/2ML IJ SOLN
INTRAMUSCULAR | Status: DC | PRN
  Administered 2013-06-16: 4 mg via INTRAVENOUS

## 2013-06-16 MED ORDER — KETOROLAC TROMETHAMINE 30 MG/ML IJ SOLN
30.0000 mg | Freq: Four times a day (QID) | INTRAMUSCULAR | Status: DC | PRN
  Administered 2013-06-17: 30 mg via INTRAVENOUS
  Filled 2013-06-16: qty 1

## 2013-06-16 MED ORDER — PROMETHAZINE HCL 25 MG/ML IJ SOLN: 6.2500 mg | INTRAMUSCULAR | Status: DC | PRN

## 2013-06-16 MED ORDER — KETOROLAC TROMETHAMINE 60 MG/2ML IM SOLN
60.0000 mg | Freq: Once | INTRAMUSCULAR | Status: AC | PRN
  Filled 2013-06-16: qty 2

## 2013-06-16 MED ORDER — MEPERIDINE HCL 25 MG/ML IJ SOLN
6.2500 mg | INTRAMUSCULAR | Status: DC | PRN
  Administered 2013-06-16: 6.25 mg via INTRAVENOUS

## 2013-06-16 MED ORDER — LACTATED RINGERS IV SOLN: 500.0000 mL | Freq: Once | INTRAVENOUS | Status: DC

## 2013-06-16 MED ORDER — OXYTOCIN 40 UNITS IN LACTATED RINGERS INFUSION - SIMPLE MED: 1.0000 m[IU]/min | INTRAVENOUS | Status: DC

## 2013-06-16 MED ORDER — HYDROMORPHONE HCL PF 1 MG/ML IJ SOLN
INTRAMUSCULAR | Status: AC
  Filled 2013-06-16: qty 1

## 2013-06-16 MED ORDER — OXYTOCIN 10 UNIT/ML IJ SOLN
INTRAMUSCULAR | Status: AC
  Filled 2013-06-16: qty 4

## 2013-06-16 MED ORDER — SODIUM CHLORIDE 0.9 % IJ SOLN: 3.0000 mL | INTRAMUSCULAR | Status: DC | PRN

## 2013-06-16 MED ORDER — PHENYLEPHRINE 40 MCG/ML (10ML) SYRINGE FOR IV PUSH (FOR BLOOD PRESSURE SUPPORT)
PREFILLED_SYRINGE | INTRAVENOUS | Status: AC
  Filled 2013-06-16: qty 5

## 2013-06-16 MED ORDER — NALOXONE HCL 0.4 MG/ML IJ SOLN: 0.4000 mg | INTRAMUSCULAR | Status: DC | PRN

## 2013-06-16 MED ORDER — OXYCODONE-ACETAMINOPHEN 5-325 MG PO TABS
1.0000 | ORAL_TABLET | ORAL | Status: DC | PRN
  Administered 2013-06-17: 1 via ORAL
  Filled 2013-06-16: qty 1

## 2013-06-16 MED ORDER — PHENYLEPHRINE HCL 10 MG/ML IJ SOLN
INTRAMUSCULAR | Status: DC | PRN
  Administered 2013-06-16: 40 ug via INTRAVENOUS
  Administered 2013-06-16 (×2): 80 ug via INTRAVENOUS

## 2013-06-16 MED ORDER — HYDROMORPHONE HCL PF 1 MG/ML IJ SOLN
0.2500 mg | INTRAMUSCULAR | Status: DC | PRN
  Administered 2013-06-16: 0.5 mg via INTRAVENOUS

## 2013-06-16 MED ORDER — MORPHINE SULFATE 0.5 MG/ML IJ SOLN
INTRAMUSCULAR | Status: AC
  Filled 2013-06-16: qty 10

## 2013-06-16 MED ORDER — FENTANYL CITRATE 0.05 MG/ML IJ SOLN
INTRAMUSCULAR | Status: AC
  Filled 2013-06-16: qty 2

## 2013-06-16 MED ORDER — NALBUPHINE HCL 10 MG/ML IJ SOLN
5.0000 mg | INTRAMUSCULAR | Status: DC | PRN
  Filled 2013-06-16: qty 1

## 2013-06-16 MED ORDER — OXYTOCIN 10 UNIT/ML IJ SOLN
40.0000 [IU] | INTRAVENOUS | Status: DC | PRN
  Administered 2013-06-16: 40 [IU] via INTRAVENOUS

## 2013-06-16 MED ORDER — MEPERIDINE HCL 25 MG/ML IJ SOLN: 6.2500 mg | INTRAMUSCULAR | Status: DC | PRN

## 2013-06-16 MED ORDER — CHLOROPROCAINE HCL 3 % IJ SOLN
INTRAMUSCULAR | Status: AC
  Filled 2013-06-16: qty 20

## 2013-06-16 MED ORDER — MORPHINE SULFATE (PF) 0.5 MG/ML IJ SOLN
INTRAMUSCULAR | Status: DC | PRN
  Administered 2013-06-16: 3 mg via INTRATHECAL
  Administered 2013-06-16 (×2): 1 mg via INTRAVENOUS

## 2013-06-16 MED ORDER — LACTATED RINGERS IV SOLN
INTRAVENOUS | Status: DC | PRN
  Administered 2013-06-16 (×2): via INTRAVENOUS

## 2013-06-16 MED ORDER — NALOXONE HCL 1 MG/ML IJ SOLN: 1.0000 ug/kg/h | INTRAMUSCULAR | Status: DC | PRN

## 2013-06-16 MED ORDER — FENTANYL 2.5 MCG/ML BUPIVACAINE 1/10 % EPIDURAL INFUSION (WH - ANES): INTRAMUSCULAR | Status: DC | PRN

## 2013-06-16 MED ORDER — KETOROLAC TROMETHAMINE 30 MG/ML IJ SOLN
INTRAMUSCULAR | Status: AC
  Administered 2013-06-16: 30 mg via INTRAVENOUS
  Filled 2013-06-16: qty 1

## 2013-06-16 MED ORDER — DIPHENHYDRAMINE HCL 50 MG/ML IJ SOLN: 12.5000 mg | INTRAMUSCULAR | Status: DC | PRN

## 2013-06-16 MED ORDER — SODIUM BICARBONATE 8.4 % IV SOLN
INTRAVENOUS | Status: DC | PRN
  Administered 2013-06-16: 2 mL via EPIDURAL

## 2013-06-16 MED ORDER — IBUPROFEN 800 MG PO TABS
800.0000 mg | ORAL_TABLET | Freq: Three times a day (TID) | ORAL | Status: DC | PRN
  Administered 2013-06-17: 800 mg via ORAL
  Filled 2013-06-16: qty 1

## 2013-06-16 MED ORDER — ONDANSETRON HCL 4 MG/2ML IJ SOLN: 4.0000 mg | Freq: Three times a day (TID) | INTRAMUSCULAR | Status: DC | PRN

## 2013-06-16 MED ORDER — DIPHENHYDRAMINE HCL 25 MG PO CAPS: 25.0000 mg | ORAL_CAPSULE | ORAL | Status: DC | PRN

## 2013-06-16 SURGICAL SUPPLY — 45 items
ADH SKN CLS APL DERMABOND .7 (GAUZE/BANDAGES/DRESSINGS) ×1
CANISTER WOUND CARE 500ML ATS (WOUND CARE) IMPLANT
CLAMP CORD UMBIL (MISCELLANEOUS) IMPLANT
CLOTH BEACON ORANGE TIMEOUT ST (SAFETY) ×2 IMPLANT
CONTAINER PREFILL 10% NBF 15ML (MISCELLANEOUS) ×4 IMPLANT
DERMABOND ADVANCED (GAUZE/BANDAGES/DRESSINGS) ×1
DERMABOND ADVANCED .7 DNX12 (GAUZE/BANDAGES/DRESSINGS) ×1 IMPLANT
DRAPE LG THREE QUARTER DISP (DRAPES) ×2 IMPLANT
DRSG OPSITE POSTOP 4X10 (GAUZE/BANDAGES/DRESSINGS) ×2 IMPLANT
DRSG VAC ATS LRG SENSATRAC (GAUZE/BANDAGES/DRESSINGS) IMPLANT
DRSG VAC ATS MED SENSATRAC (GAUZE/BANDAGES/DRESSINGS) IMPLANT
DRSG VAC ATS SM SENSATRAC (GAUZE/BANDAGES/DRESSINGS) IMPLANT
DURAPREP 26ML APPLICATOR (WOUND CARE) ×2 IMPLANT
ELECT REM PT RETURN 9FT ADLT (ELECTROSURGICAL) ×2
ELECTRODE REM PT RTRN 9FT ADLT (ELECTROSURGICAL) ×1 IMPLANT
EXTRACTOR VACUUM M CUP 4 TUBE (SUCTIONS) IMPLANT
GLOVE BIO SURGEON STRL SZ8 (GLOVE) ×4 IMPLANT
GLOVE SS N UNI LF 7.0 STRL (GLOVE) ×2 IMPLANT
GLOVE SURG SS PI 6.5 STRL IVOR (GLOVE) ×1 IMPLANT
GOWN PREVENTION PLUS XLARGE (GOWN DISPOSABLE) ×2 IMPLANT
GOWN STRL REIN XL XLG (GOWN DISPOSABLE) ×4 IMPLANT
KIT ABG SYR 3ML LUER SLIP (SYRINGE) IMPLANT
NDL HYPO 25X5/8 SAFETYGLIDE (NEEDLE) ×1 IMPLANT
NEEDLE HYPO 25X5/8 SAFETYGLIDE (NEEDLE) ×2 IMPLANT
NS IRRIG 1000ML POUR BTL (IV SOLUTION) ×2 IMPLANT
PACK C SECTION WH (CUSTOM PROCEDURE TRAY) ×2 IMPLANT
PAD OB MATERNITY 4.3X12.25 (PERSONAL CARE ITEMS) ×2 IMPLANT
RTRCTR C-SECT PINK 25CM LRG (MISCELLANEOUS) ×2 IMPLANT
STAPLER VISISTAT 35W (STAPLE) IMPLANT
SUT GUT PLAIN 0 CT-3 TAN 27 (SUTURE) IMPLANT
SUT MNCRL 0 VIOLET CTX 36 (SUTURE) ×3 IMPLANT
SUT MNCRL AB 4-0 PS2 18 (SUTURE) IMPLANT
SUT MON AB 2-0 CT1 27 (SUTURE) ×2 IMPLANT
SUT MON AB 3-0 SH 27 (SUTURE)
SUT MON AB 3-0 SH27 (SUTURE) IMPLANT
SUT MONOCRYL 0 CTX 36 (SUTURE) ×3
SUT PDS AB 0 CTX 60 (SUTURE) IMPLANT
SUT PLAIN 2 0 XLH (SUTURE) ×1 IMPLANT
SUT VIC AB 0 CTX 36 (SUTURE)
SUT VIC AB 0 CTX36XBRD ANBCTRL (SUTURE) IMPLANT
SUT VIC AB 2-0 CT1 27 (SUTURE)
SUT VIC AB 2-0 CT1 TAPERPNT 27 (SUTURE) IMPLANT
TOWEL OR 17X24 6PK STRL BLUE (TOWEL DISPOSABLE) ×6 IMPLANT
TRAY FOLEY CATH 14FR (SET/KITS/TRAYS/PACK) ×2 IMPLANT
WATER STERILE IRR 1000ML POUR (IV SOLUTION) ×2 IMPLANT

## 2013-06-16 NOTE — Progress Notes (Signed)
MD called about POC. Possible AROM and pitocin

## 2013-06-16 NOTE — Anesthesia Postprocedure Evaluation (Signed)
Anesthesia Post Note  Patient: Melanie [name]  Procedure(s) Performed: Procedure(s) (LRB): CESAREAN SECTION with left side tubal ligation (N/A)  Anesthesia type: Epidural  Patient location: PACU  Post pain: Pain level controlled  Post assessment: Post-op Vital signs reviewed  Last Vitals:  Filed Vitals:   06/16/13 1316  BP: 110/53  Pulse: 100  Temp:   Resp: 20    Post vital signs: Reviewed  Level of consciousness: awake  Complications: No apparent anesthesia complications

## 2013-06-16 NOTE — Consult Note (Signed)
Neonatology Note:   Attendance at C-section:    I was asked by Dr. Clearance Coots to attend this primary C/S at term due to failed induction. The mother is a G2P1 O neg, GBS pos with adequate antibiotic prophylaxis, afebrile during labor. ROM at delivery, fluid clear. Infant vigorous with good spontaneous cry and tone. Needed only minimal bulb suctioning. Ap 9/10. Lungs clear to ausc in DR. To CN to care of Pediatrician.   Doretha Sou, MD

## 2013-06-16 NOTE — Progress Notes (Signed)
MD called as per pt. Informed that pt is fustrauted and would like something else to be done since nothing is working. MD stated he will be in this morning and to place cytotec

## 2013-06-16 NOTE — Progress Notes (Signed)
Melanie Melanie [name] is Melanie [name] 32 y.o. G2P1001 at [redacted]w[redacted]d by LMP admitted for induction of labor due to Post dates. Due date 06-13-13.  Subjective:   Objective: BP 118/68  Pulse 67  Temp(Src) 98.2 F (36.8 C) (Oral)  Resp 18  Ht 5\' 10"  (1.778 m)  Wt 258 lb (117.028 kg)  BMI 37.02 kg/m2  SpO2 100%  LMP 09/11/2012      FHT:  FHR: 150 bpm, variability: moderate,  accelerations:  Present,  decelerations:  Absent UC:   regular, every 3-5 minutes SVE:   Dilation: 4 Effacement (%): 70 Station: -3 Exam by:: Dr. Clearance [name]  Labs: Lab Results  Component Value Date   WBC 12.5* 06/16/2013   HGB 10.5* 06/16/2013   HCT 32.5* 06/16/2013   MCV 84.9 06/16/2013   PLT 122* 06/16/2013    Assessment / Plan: 40.3 weeks.  Thrombocytopenia.  Stable platelet count.   2 stage IOL.  Continue low dose pitocin.  Labor: Latent phase Preeclampsia:  n/Melanie [name] Fetal Wellbeing:  Category I Pain Control:  Nubain I/D:  n/Melanie [name] Anticipated MOD:  NSVD  Melanie Melanie [name] 06/16/2013, 10:06 AM

## 2013-06-16 NOTE — Progress Notes (Signed)
Melanie [name] is a 32 y.o. G2P1001 at [redacted]w[redacted]d by LMP admitted for induction of labor due to Post dates. Due date 06-13-13.  Subjective:   Objective: BP 117/64  Pulse 79  Temp(Src) 98.2 F (36.8 C) (Oral)  Resp 18  Ht 5\' 10"  (1.778 m)  Wt 258 lb (117.028 kg)  BMI 37.02 kg/m2  SpO2 100%  LMP 09/11/2012      FHT:  FHR: 150 bpm, variability: moderate,  accelerations:  Present,  decelerations:  Absent UC:   regular, every 3-4 minutes SVE:   Dilation: 4 Effacement (%): 70 Station: -3 Exam by:: Dr. Clearance Coots  Labs: Lab Results  Component Value Date   WBC 12.5* 06/16/2013   HGB 10.5* 06/16/2013   HCT 32.5* 06/16/2013   MCV 84.9 06/16/2013   PLT 122* 06/16/2013    Assessment / Plan: IOL for postdates.  Thrombocytopenia.  Day 2 IOL.  No progress for greater than 4 hours.  Will proceed with C/S for failed IOL, arrest of 1st stage of labor.  Labor: No progress Preeclampsia:  n/a Fetal Wellbeing:  Category I Pain Control:  Nubain I/D:  n/a Anticipated MOD:  Cesarean Section  Melanie [name] A 06/16/2013, 1:10 PM

## 2013-06-16 NOTE — Transfer of Care (Signed)
Immediate Anesthesia Transfer of Care Note  Patient: Melanie [name]  Procedure(s) Performed: Procedure(s) with comments: CESAREAN SECTION with left side tubal ligation (N/A) - left side tubal ligation, patient has no right side fallopian tube  Patient Location: PACU  Anesthesia Type:Epidural  Level of Consciousness: awake, alert  and oriented  Airway & Oxygen Therapy: Patient Spontanous Breathing  Post-op Assessment: Report given to PACU RN and Post -op Vital signs reviewed and stable  Post vital signs: Reviewed and stable  Complications: No apparent anesthesia complications

## 2013-06-16 NOTE — Anesthesia Postprocedure Evaluation (Signed)
  Anesthesia Post-op Note  Anesthesia Post Note  Patient: Melanie [name]  Procedure(s) Performed: Procedure(s) (LRB): CESAREAN SECTION with left side tubal ligation (N/A)  Anesthesia type: Epidural  Patient location: Mother/Baby  Post pain: Pain level controlled  Post assessment: Post-op Vital signs reviewed  Last Vitals:  Filed Vitals:   06/16/13 1747  BP: 120/79  Pulse: 69  Temp: 36.6 C  Resp: 16    Post vital signs: Reviewed  Level of consciousness:alert  Complications: No apparent anesthesia complications

## 2013-06-16 NOTE — Op Note (Signed)
Cesarean Section Procedure Note   Melanie [name]   06/14/2013 - 06/16/2013  Indications: Failed Induction of Labor.  Arrest of 1st Stage of Labor  Procedure:  Primary LTCS and Left Partial Salpingectomy  Pre-operative Diagnosis: Failed Induction. Desires sterilization.   Post-operative Diagnosis: Same   Surgeon: Coral Ceo A  Assistants: Antionette Char  Anesthesia: epidural  Procedure Details:  The patient was seen in the Holding Room. The risks, benefits, complications, treatment options, and expected outcomes were discussed with the patient. The patient concurred with the proposed plan, giving informed consent. The patient was identified as Melanie [name] and the procedure verified as C-Section Delivery. A Time Out was held and the above information confirmed.  After induction of anesthesia, the patient was draped and prepped in the usual sterile manner. A transverse incision was made and carried down through the subcutaneous tissue to the fascia. The fascial incision was made and extended transversely. The fascia was separated from the underlying rectus tissue superiorly and inferiorly. The peritoneum was identified and entered. The peritoneal incision was extended longitudinally. The utero-vesical peritoneal reflection was incised transversely and the bladder flap was bluntly freed from the lower uterine segment. A low transverse uterine incision was made. Delivered from cephalic presentation was a 3305 gram living newborn female infant(s). APGAR (1 MIN): 9   APGAR (5 MINS): 10   APGAR (10 MINS):    A cord ph was not sent. The umbilical cord was clamped and cut cord. A sample was obtained for evaluation. The placenta was removed Intact and appeared normal.  The uterine incision was closed with running locked sutures of 1-0 Monocryl. A second imbricating layer of the same suture was placed.  Hemostasis was observed.   Attention then turned above to tubal ligation procedure.  The left  fallopian tube was grasped in the isthmic area with a Babcock clamp and suture ligated through the mesosalpinx.  ~ 2cm segment of tube beneath the Babcock clamp was excised and submitted to pathology for evaluation.  The right fallopian tube was absent except for a 2cm proximal segment.  The round ligament was identified separately and held with a Babcock clamp while the fallopian tube was searched for. The paracolic gutters were irrigated. The parieto peritoneum was closed in a running fashion with 2-0 Vicryl.  The fascia was then reapproximated with running sutures of 0 Vicryl.  The skin was closed with staples.  Instrument, sponge, and needle counts were correct prior the abdominal closure and were correct at the conclusion of the case.   Findings:  Right fallopian tube absent, except for 2cm proximal segment.   Estimated Blood Loss:   Total IV Fluids:   Urine Output: 125CC OF clear urine   Specimens: @ORSPECIMEN @   Complications: no complications  Disposition: PACU - hemodynamically stable.  Maternal Condition: stable   Baby condition / location:  nursery-stable    Signed: Surgeon(s): [name] Bad, MD Antionette Char, MD

## 2013-06-17 ENCOUNTER — Encounter (HOSPITAL_COMMUNITY): Payer: Self-pay | Admitting: Obstetrics

## 2013-06-17 LAB — CBC
HCT: 27.4 % — ABNORMAL LOW (ref 36.0–46.0)
Platelets: 110 10*3/uL — ABNORMAL LOW (ref 150–400)
RDW: 14.7 % (ref 11.5–15.5)
WBC: 14.5 10*3/uL — ABNORMAL HIGH (ref 4.0–10.5)

## 2013-06-17 MED ORDER — ZOLPIDEM TARTRATE 5 MG PO TABS: 5.0000 mg | ORAL_TABLET | Freq: Every evening | ORAL | Status: DC | PRN

## 2013-06-17 MED ORDER — LANOLIN HYDROUS EX OINT: 1.0000 "application " | TOPICAL_OINTMENT | CUTANEOUS | Status: DC | PRN

## 2013-06-17 MED ORDER — DIBUCAINE 1 % RE OINT: 1.0000 "application " | TOPICAL_OINTMENT | RECTAL | Status: DC | PRN

## 2013-06-17 MED ORDER — TETANUS-DIPHTH-ACELL PERTUSSIS 5-2.5-18.5 LF-MCG/0.5 IM SUSP: 0.5000 mL | Freq: Once | INTRAMUSCULAR | Status: DC

## 2013-06-17 MED ORDER — WITCH HAZEL-GLYCERIN EX PADS: 1.0000 "application " | MEDICATED_PAD | CUTANEOUS | Status: DC | PRN

## 2013-06-17 MED ORDER — OXYCODONE-ACETAMINOPHEN 5-325 MG PO TABS
1.0000 | ORAL_TABLET | ORAL | Status: DC | PRN
  Administered 2013-06-17 – 2013-06-18 (×2): 1 via ORAL
  Administered 2013-06-18: 2 via ORAL
  Administered 2013-06-19 (×2): 1 via ORAL
  Filled 2013-06-17: qty 1
  Filled 2013-06-17: qty 2
  Filled 2013-06-17 (×3): qty 1

## 2013-06-17 MED ORDER — ONDANSETRON HCL 4 MG/2ML IJ SOLN: 4.0000 mg | INTRAMUSCULAR | Status: DC | PRN

## 2013-06-17 MED ORDER — MENTHOL 3 MG MT LOZG: 1.0000 | LOZENGE | OROMUCOSAL | Status: DC | PRN

## 2013-06-17 MED ORDER — SIMETHICONE 80 MG PO CHEW
80.0000 mg | CHEWABLE_TABLET | Freq: Three times a day (TID) | ORAL | Status: DC
  Administered 2013-06-17 – 2013-06-18 (×4): 80 mg via ORAL

## 2013-06-17 MED ORDER — OXYTOCIN 40 UNITS IN LACTATED RINGERS INFUSION - SIMPLE MED: 62.5000 mL/h | INTRAVENOUS | Status: AC

## 2013-06-17 MED ORDER — IBUPROFEN 600 MG PO TABS
600.0000 mg | ORAL_TABLET | Freq: Four times a day (QID) | ORAL | Status: DC
  Administered 2013-06-18 – 2013-06-19 (×6): 600 mg via ORAL
  Filled 2013-06-17 (×6): qty 1

## 2013-06-17 MED ORDER — SENNOSIDES-DOCUSATE SODIUM 8.6-50 MG PO TABS
2.0000 | ORAL_TABLET | Freq: Every day | ORAL | Status: DC
  Administered 2013-06-17 – 2013-06-18 (×2): 2 via ORAL

## 2013-06-17 MED ORDER — DIPHENHYDRAMINE HCL 25 MG PO CAPS: 25.0000 mg | ORAL_CAPSULE | Freq: Four times a day (QID) | ORAL | Status: DC | PRN

## 2013-06-17 MED ORDER — PRENATAL MULTIVITAMIN CH
1.0000 | ORAL_TABLET | Freq: Every day | ORAL | Status: DC
  Administered 2013-06-17 – 2013-06-18 (×2): 1 via ORAL
  Filled 2013-06-17: qty 1

## 2013-06-17 MED ORDER — RHO D IMMUNE GLOBULIN 1500 UNIT/2ML IJ SOLN
300.0000 ug | Freq: Once | INTRAMUSCULAR | Status: AC
  Administered 2013-06-17: 300 ug via INTRAVENOUS
  Filled 2013-06-17: qty 2

## 2013-06-17 MED ORDER — SIMETHICONE 80 MG PO CHEW: 80.0000 mg | CHEWABLE_TABLET | ORAL | Status: DC | PRN

## 2013-06-17 MED ORDER — ONDANSETRON HCL 4 MG PO TABS: 4.0000 mg | ORAL_TABLET | ORAL | Status: DC | PRN

## 2013-06-17 MED ORDER — LACTATED RINGERS IV SOLN: INTRAVENOUS | Status: DC

## 2013-06-17 NOTE — Progress Notes (Signed)
Subjective: Postpartum Day 1: Cesarean Delivery Patient reports tolerating PO, + flatus and no problems voiding.    Objective: Vital signs in last 24 hours: Temp:  [97.4 F (36.3 C)-98.6 F (37 C)] 98.4 F (36.9 C) (07/18 0515) Pulse Rate:  [63-100] 79 (07/18 0515) Resp:  [12-20] 16 (07/18 0515) BP: (104-130)/(51-101) 108/69 mmHg (07/18 0515) SpO2:  [97 %-100 %] 99 % (07/18 0515)  Physical Exam:  General: alert and no distress Lochia: appropriate Uterine Fundus: firm Incision: healing well DVT Evaluation: No evidence of DVT seen on physical exam.   Recent Labs  06/16/13 0654 06/16/13 1510  HGB 10.5* 9.5*  HCT 32.5* 29.4*    Assessment/Plan: Status post Cesarean section. Doing well postoperatively.  Continue current care.  HARPER,CHARLES A 06/17/2013, 10:54 AM

## 2013-06-18 LAB — RH IG WORKUP (INCLUDES ABO/RH)
ABO/RH(D): O NEG
Fetal Screen: NEGATIVE
Unit division: 0

## 2013-06-18 LAB — CBC
HCT: 24.8 % — ABNORMAL LOW (ref 36.0–46.0)
Hemoglobin: 8.1 g/dL — ABNORMAL LOW (ref 12.0–15.0)
MCH: 27.6 pg (ref 26.0–34.0)
MCHC: 32.7 g/dL (ref 30.0–36.0)
MCV: 84.6 fL (ref 78.0–100.0)
RBC: 2.93 MIL/uL — ABNORMAL LOW (ref 3.87–5.11)

## 2013-06-18 NOTE — Progress Notes (Signed)
Subjective: Postpartum Day 2: Cesarean Delivery Patient reports tolerating PO, + flatus and no problems voiding.    Objective: Vital signs in last 24 hours: Temp:  [98.2 F (36.8 C)] 98.2 F (36.8 C) (07/19 0610) Pulse Rate:  [84-86] 84 (07/19 0610) Resp:  [18] 18 (07/19 0610) BP: (120-139)/(71-80) 120/80 mmHg (07/19 0610)  Physical Exam:  General: alert and no distress Lochia: appropriate Uterine Fundus: firm Incision: healing well DVT Evaluation: No evidence of DVT seen on physical exam.   Recent Labs  06/17/13 0610 06/18/13 0609  HGB 8.9* 8.1*  HCT 27.4* 24.8*    Assessment/Plan: Status post Cesarean section. Doing well postoperatively.  Anemia, clinically stable.  Iron Rx. Continue current care.  Jaquayla Hege A 06/18/2013, 7:36 AM

## 2013-06-19 MED ORDER — OXYCODONE-ACETAMINOPHEN 5-325 MG PO TABS: 1.0000 | ORAL_TABLET | ORAL | Status: DC | PRN

## 2013-06-19 MED ORDER — IBUPROFEN 600 MG PO TABS: 600.0000 mg | ORAL_TABLET | Freq: Four times a day (QID) | ORAL | Status: DC | PRN

## 2013-06-19 NOTE — Discharge Summary (Signed)
Obstetric Discharge Summary Reason for Admission: induction of labor Prenatal Procedures: ultrasound Intrapartum Procedures: cesarean: low cervical, transverse Postpartum Procedures: none Complications-Operative and Postpartum: none Hemoglobin  Date Value Range Status  06/18/2013 8.1* 12.0 - 15.0 g/dL Final  12/06/1094 04.5   Final     HCT  Date Value Range Status  06/18/2013 24.8* 36.0 - 46.0 % Final  12/08/2012 38   Final    Physical Exam:  General: alert and no distress Lochia: appropriate Uterine Fundus: firm Incision: healing well DVT Evaluation: No evidence of DVT seen on physical exam.  Discharge Diagnoses: Term Pregnancy-delivered.  Anemia.  Clinically stable.  Iron Rx.  Discharge Information: Date: 06/19/2013 Activity: pelvic rest Diet: routine Medications: PNV, Ibuprofen, Colace, Iron and Percocet Condition: stable Instructions: refer to practice specific booklet Discharge to: home Follow-up Information   Follow up with Keela Rubert A, MD. Schedule an appointment as soon as possible for a visit in 2 weeks.   Contact information:   [address] [place] [address] [phone]       Newborn Data: Live born female  Birth Weight: 7 lb 4.6 oz (3305 g) APGAR: 9, 10  Home with mother.  Kimela Malstrom A 06/19/2013, 6:44 AM

## 2013-06-19 NOTE — Progress Notes (Signed)
Subjective: Postpartum Day 3: Cesarean Delivery Patient reports tolerating PO, + flatus and no problems voiding.    Objective: Vital signs in last 24 hours: Temp:  [97.9 F (36.6 C)-98 F (36.7 C)] 97.9 F (36.6 C) (07/20 0554) Pulse Rate:  [88-92] 92 (07/20 0554) Resp:  [18-20] 18 (07/20 0554) BP: (118-123)/(78-82) 123/78 mmHg (07/20 0554)  Physical Exam:  General: alert and no distress Lochia: appropriate Uterine Fundus: firm Incision: healing well DVT Evaluation: No evidence of DVT seen on physical exam.   Recent Labs  06/17/13 0610 06/18/13 0609  HGB 8.9* 8.1*  HCT 27.4* 24.8*    Assessment/Plan: Status post Cesarean section. Doing well postoperatively.  Discharge home with standard precautions and return to clinic in 2 weeks.  HARPER,CHARLES A 06/19/2013, 6:37 AM

## 2013-06-21 ENCOUNTER — Ambulatory Visit (INDEPENDENT_AMBULATORY_CARE_PROVIDER_SITE_OTHER): Payer: BC Managed Care – PPO | Admitting: Obstetrics

## 2013-06-21 ENCOUNTER — Encounter: Payer: Self-pay | Admitting: Obstetrics

## 2013-06-21 NOTE — Progress Notes (Signed)
   Subjective:     Melanie [name] is a 32 y.o. female who presents for a postpartum visit. She is 5 days postpartum following a low cervical transverse Cesarean section. I have fully reviewed the prenatal and intrapartum course. The delivery was at 39  gestational weeks. Outcome: primary cesarean section, low transverse incision. Anesthesia: epidural. Postpartum course has been normmal. Baby's course has been normal. Baby is feeding by both breast and bottle - unknown. Bleeding no bleeding. Bowel function is normal. Bladder function is normal. Patient is not sexually active. Contraception method is abstinence. Postpartum depression screening: negative.  The following portions of the patient's history were reviewed and updated as appropriate: allergies, current medications, past family history, past medical history, past social history, past surgical history and problem list.  Review of Systems Pertinent items are noted in HPI.   Objective:    BP 135/86  Pulse 74  Temp(Src) 97.3 F (36.3 C) (Oral)  Wt 255 lb (115.667 kg)  BMI 36.59 kg/m2  LMP 09/11/2012  Breastfeeding? No  General:  alert and no distress   Breasts:  inspection negative, no nipple discharge or bleeding, no masses or nodularity palpable  Abdomen:  Soft, NT.  Staples removed and steri strips applied.                                 Assessment:     Normal postpartum exam. Pap smear not done at today's visit.   Plan:    1. Contraception: abstinence 2. Contraceptive options discussed. 3. Follow up in: 4 weeks or as needed.

## 2013-06-23 ENCOUNTER — Encounter: Payer: Self-pay | Admitting: Obstetrics

## 2013-07-07 ENCOUNTER — Other Ambulatory Visit: Payer: Self-pay | Admitting: *Deleted

## 2013-07-07 ENCOUNTER — Ambulatory Visit (INDEPENDENT_AMBULATORY_CARE_PROVIDER_SITE_OTHER): Payer: BC Managed Care – PPO | Admitting: Obstetrics

## 2013-07-07 ENCOUNTER — Encounter: Payer: Self-pay | Admitting: *Deleted

## 2013-07-07 NOTE — Progress Notes (Signed)
Subjective:     Melanie [name] is a 32 y.o. female who presents for a postpartum visit. She is 3 weeks postpartum following a low cervical transverse Cesarean section. I have fully reviewed the prenatal and intrapartum course. The delivery was at 40 gestational weeks. Outcome: primary cesarean section, low transverse incision. Anesthesia: epidural. Postpartum course has been normal. Baby's course has been normal. Baby is feeding by bottle - Enfamil with Iron. Bleeding no bleeding. Bowel function is normal. Bladder function is normal. Patient is not sexually active. Contraception method is tubal ligation. Postpartum depression screening: negative.  The following portions of the patient's history were reviewed and updated as appropriate: allergies, current medications, past family history, past medical history, past social history, past surgical history and problem list.  Review of Systems Pertinent items are noted in HPI.   Objective:    There were no vitals taken for this visit.  General:  alert and no distress   Breasts:  inspection negative, no nipple discharge or bleeding, no masses or nodularity palpable  Lungs: clear to auscultation bilaterally  Heart:  regular rate and rhythm, S1, S2 normal, no murmur, click, rub or gallop  Abdomen: normal findings: soft, non-tender and incision C, D, I and healing well   Vulva:  not evaluated  Vagina: not evaluated  Cervix:  not evaluated  Corpus: not examined  Adnexa:  not evaluated  Rectal Exam: Not performed.        Assessment:     Normal postpartum exam. Pap smear not done at today's visit.   Plan:    1. Contraception: Left partial salpingectomy done at [facility].  Right tube previously removed and not identified. 2. Continue PNV's 3. Follow up in: 6 weeks or as needed.

## 2013-07-08 ENCOUNTER — Encounter: Payer: Self-pay | Admitting: Obstetrics

## 2013-08-18 ENCOUNTER — Encounter: Payer: Self-pay | Admitting: Obstetrics

## 2013-08-18 ENCOUNTER — Ambulatory Visit (INDEPENDENT_AMBULATORY_CARE_PROVIDER_SITE_OTHER): Payer: BC Managed Care – PPO | Admitting: Obstetrics

## 2013-08-18 DIAGNOSIS — E669 Obesity, unspecified: Secondary | ICD-10-CM

## 2013-08-18 MED ORDER — PHENTERMINE HCL 37.5 MG PO CAPS: 37.5000 mg | ORAL_CAPSULE | ORAL | Status: DC

## 2013-08-18 NOTE — Progress Notes (Signed)
.   Subjective:     Melanie [name] is a 32 y.o. female who presents for a postpartum visit. She is 7 weeks postpartum following a low cervical transverse Cesarean section. I have fully reviewed the prenatal and intrapartum course. The delivery was at 40 gestational weeks. Outcome: primary cesarean section, low transverse incision. Anesthesia: epidural. Postpartum course has been normal. Baby's course has been normal. Baby is feeding by bottle - Enfamil with Iron. Bleeding no bleeding. Bowel function is normal. Bladder function is normal. Patient is sexually active. Contraception method is none. Tubal Ligation.  Postpartum depression screening: negative.  The following portions of the patient's history were reviewed and updated as appropriate: allergies, current medications, past family history, past medical history, past social history, past surgical history and problem list.  Review of Systems Pertinent items are noted in HPI.   Objective:    BP 148/84  Pulse 69  Temp(Src) 98 F (36.7 C) (Oral)  Ht 5\' 10"  (1.778 m)  Wt 239 lb 12.8 oz (108.773 kg)  BMI 34.41 kg/m2  LMP 08/09/2013  Breastfeeding? No  General:  alert and no distress  Abdomen: Soft, NT. Pelvic: NEFG.  Uterus NSSC, NT                                    Assessment:     Normal  postpartum exam. Pap smear not done at today's visit.   Plan:    1. Contraception: tubal ligation   2. Follow up in: several months or as needed.

## 2013-09-09 ENCOUNTER — Encounter: Payer: Self-pay | Admitting: Obstetrics

## 2014-10-02 ENCOUNTER — Encounter: Payer: Self-pay | Admitting: Obstetrics

## 2015-04-18 ENCOUNTER — Emergency Department (HOSPITAL_COMMUNITY)
Admission: EM | Admit: 2015-04-18 | Discharge: 2015-04-18 | Disposition: A | Discharge status: Home / Self Care | Payer: Worker's Compensation | Attending: Emergency Medicine | Admitting: Emergency Medicine

## 2015-04-18 ENCOUNTER — Encounter (HOSPITAL_COMMUNITY): Payer: Self-pay | Admitting: Emergency Medicine

## 2015-04-18 DIAGNOSIS — Z23 Encounter for immunization: Secondary | ICD-10-CM | POA: Diagnosis not present

## 2015-04-18 DIAGNOSIS — W540XXA Bitten by dog, initial encounter: Secondary | ICD-10-CM | POA: Diagnosis not present

## 2015-04-18 DIAGNOSIS — S7012XA Contusion of left thigh, initial encounter: Secondary | ICD-10-CM | POA: Diagnosis not present

## 2015-04-18 DIAGNOSIS — Y9389 Activity, other specified: Secondary | ICD-10-CM | POA: Insufficient documentation

## 2015-04-18 DIAGNOSIS — Y998 Other external cause status: Secondary | ICD-10-CM | POA: Insufficient documentation

## 2015-04-18 DIAGNOSIS — S7002XA Contusion of left hip, initial encounter: Secondary | ICD-10-CM | POA: Insufficient documentation

## 2015-04-18 DIAGNOSIS — R011 Cardiac murmur, unspecified: Secondary | ICD-10-CM | POA: Insufficient documentation

## 2015-04-18 DIAGNOSIS — S71101A Unspecified open wound, right thigh, initial encounter: Secondary | ICD-10-CM | POA: Diagnosis not present

## 2015-04-18 DIAGNOSIS — S71151A Open bite, right thigh, initial encounter: Secondary | ICD-10-CM

## 2015-04-18 DIAGNOSIS — Z8619 Personal history of other infectious and parasitic diseases: Secondary | ICD-10-CM | POA: Diagnosis not present

## 2015-04-18 DIAGNOSIS — Z8742 Personal history of other diseases of the female genital tract: Secondary | ICD-10-CM | POA: Insufficient documentation

## 2015-04-18 DIAGNOSIS — Z79899 Other long term (current) drug therapy: Secondary | ICD-10-CM | POA: Insufficient documentation

## 2015-04-18 DIAGNOSIS — Y9289 Other specified places as the place of occurrence of the external cause: Secondary | ICD-10-CM | POA: Insufficient documentation

## 2015-04-18 MED ORDER — AMOXICILLIN-POT CLAVULANATE 875-125 MG PO TABS
1.0000 | ORAL_TABLET | Freq: Two times a day (BID) | ORAL | Status: DC
Start: 2015-04-18 — End: 2016-01-11

## 2015-04-18 MED ORDER — TETANUS-DIPHTH-ACELL PERTUSSIS 5-2.5-18.5 LF-MCG/0.5 IM SUSP
0.5000 mL | Freq: Once | INTRAMUSCULAR | Status: AC
  Administered 2015-04-18: 0.5 mL via INTRAMUSCULAR
  Filled 2015-04-18: qty 0.5

## 2015-04-18 MED ORDER — TRAMADOL HCL 50 MG PO TABS
50.0000 mg | ORAL_TABLET | Freq: Four times a day (QID) | ORAL | Status: DC | PRN
Start: 2015-04-18 — End: 2016-01-11

## 2015-04-18 MED ORDER — BACITRACIN ZINC 500 UNIT/GM EX OINT
TOPICAL_OINTMENT | CUTANEOUS | Status: AC
  Administered 2015-04-18: 1
  Filled 2015-04-18: qty 0.9

## 2015-04-18 NOTE — Discharge Instructions (Signed)

## 2015-04-18 NOTE — ED Notes (Signed)
Bed: WTR7 Expected date:  Expected time:  Means of arrival:  Comments: Dog bite

## 2015-04-18 NOTE — ED Provider Notes (Signed)
CSN: [id number]     Arrival date & time 04/18/15  1434 History  This chart was scribed for non-physician practitioner Vangie BickerErin O' San MorelleMalley, PA-C, working with Mirian MoMatthew Gentry, MD, by Andrew Auaven Small, ED Scribe. This patient was seen in room WTR7/WTR7 and the patient's care was started at 3:28 PM.   Chief Complaint  Patient presents with  . Animal Bite   The history is provided by the patient. No language interpreter was used.   Melanie SorrowShemika H [name] is a 34 y.o. female brought in by ambulance who presents to the Emergency Department complaining of a dog bite. 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 She now has a stinging bite wound to right upper leg and left leg pain due to falling on her left hip after being attacked.  Pain in right and left upper legs is aching and sore, 8/10 at worst.  No pain medication PTA.  Pt is unsure if the dogs is UTD on immunizations but states animal control was able to capture the dog and will get back to her by tomorrow. Pt is unsure if she's UTD on TDAP. She denies drug allergies.  Denies any other injuries or concerns.   Past Medical History  Diagnosis Date  . Heart murmur   . Chicken pox chicken pox  . Ovarian cyst   . Fracture of arm     right   Past Surgical History  Procedure Laterality Date  . Ovarian cystectomy N/A   . Cesarean section N/A 06/16/2013    Procedure: CESAREAN SECTION with left side tubal ligation;  Surgeon: [name] Badharles A Harper, MD;  Location: [facility];  Service: Obstetrics;  Laterality: N/A;  left side tubal ligation, patient has no right side fallopian tube   Family History  Problem Relation Age of Onset  . Cancer Neg Hx   . Stroke Neg Hx   . Diabetes Paternal Grandmother   . Hypertension Father    History  Substance Use Topics  . Smoking status: Never Smoker   . Smokeless tobacco: Never Used  . Alcohol Use: No   OB History    Gravida Para Term Preterm AB TAB SAB Ectopic Multiple Living   2 2 2        2      Review of Systems  Musculoskeletal: Positive for myalgias.  Skin: Positive for wound.  Neurological: Negative for weakness and numbness.   Allergies  Review of patient's allergies indicates no known allergies.  Home Medications   Prior to Admission medications   Medication Sig Start Date End Date Taking? Authorizing Provider  amoxicillin-clavulanate (AUGMENTIN) 875-125 MG per tablet Take 1 tablet by mouth 2 (two) times daily. One po bid x 7 days 04/18/15   Junius FinnerErin O'Malley, PA-C  ibuprofen (ADVIL,MOTRIN) 600 MG tablet Take 1 tablet (600 mg total) by mouth every 6 (six) hours as needed for pain. 06/19/13   [name] Badharles A Harper, MD  oxyCODONE-acetaminophen (PERCOCET/ROXICET) 5-325 MG per tablet Take 1-2 tablets by mouth every 4 (four) hours as needed for pain. 06/19/13   [name] Badharles A Harper, MD  phentermine 37.5 MG capsule Take 1 capsule (37.5 mg total) by mouth every morning. 08/18/13   [name] Badharles A Harper, MD  traMADol (ULTRAM) 50 MG tablet Take 1 tablet (50 mg total) by mouth every 6 (six) hours as needed. 04/18/15   Junius FinnerErin O'Malley, PA-C   BP 105/92 mmHg  Pulse 79  Temp(Src) 98.1 F (36.7 C) (Oral)  SpO2 100% Physical Exam  Constitutional: She is oriented to person, place, and time. She appears well-developed and well-nourished. No distress.  HENT:  Head: Normocephalic and atraumatic.  Eyes: Conjunctivae and EOM are normal.  Neck: Neck supple.  Cardiovascular: Normal rate.   Pulmonary/Chest: Effort normal.  Musculoskeletal: Normal range of motion. She exhibits tenderness. She exhibits no edema.  Left hip: no obvious deformity, mild tenderness with ecchymosis to Left lateral hip and thigh. FROM left hip. Normal gait.   Neurological: She is alert and oriented to person, place, and time.  Skin: Skin is warm and dry.  Right anterior thigh: 5cm x 5cm area of skin tear, scant red bleeding.  Mild tenderness to area. No deep puncture wounds.  Psychiatric: She has a normal mood and affect. Her  behavior is normal.  Nursing note and vitals reviewed.   ED Course  Procedures   The wound is cleansed, debrided of foreign material as much as possible, and dressed. The patient is alerted to watch for any signs of infection (redness, pus, pain, increased swelling or fever) and call if such occurs. Home wound care instructions are provided. Tetanus vaccination status reviewed: TDap   DIAGNOSTIC STUDIES: Oxygen Saturation is 100% on RA, normal by my interpretation.    COORDINATION OF CARE: 3:33 PM- Pt advised of plan for treatment which includes wound care and pt agrees.  Labs Review Labs Reviewed - No data to display  Imaging Review No results found.   EKG Interpretation None      MDM   Final diagnoses:  Dog bite of right thigh, initial encounter  Contusion of left hip and thigh, initial encounter    Pt is a 33yo female presenting to ED with c/o dog bite to Right anterior thigh and Left hip after being bit by a clients' pitbull. Pt unsure if dog UTD on vaccines but states animal control has custody of dog and will get back to pt by tomorrow. Pt decided to hold off on rabies vaccine today, will f/u with animal control and [facility] tomorrow if needed.  TDap updated in ED.   Dog bite on Right anterior thigh is a skin tear, no deep puncture wounds.  No skin close indicated at this time. Wound cleaned thoroughly in ED. Bandage placed. Home care instructions provided. Rx: augmentin. Advised to f/u with PCP in 3-4 days for wound recheck if symptoms not improving.  Return precautions provided. Pt verbalized understanding and agreement with tx plan.   I personally performed the services described in this documentation, which was scribed in my presence. The recorded information has been reviewed and is accurate.     Junius Finnerrin O'Malley, PA-C 04/18/15 1634  Mirian MoMatthew Gentry, MD 04/20/15 0830

## 2015-04-18 NOTE — ED Notes (Signed)
Per EMS-patient was bitten by a dog today. Dog is believed to have had rabies shots. 2-3 puncture wounds on top of right leg. 5 cm x 5 cm skin tear on top of right leg. Dog is approximately 50 lbs and is a pit bull. VS: 162/88 HR 80 Regular RR 14 Pain 4/10.

## 2015-11-23 ENCOUNTER — Other Ambulatory Visit: Payer: Self-pay | Admitting: Neurosurgery

## 2016-01-14 NOTE — Pre-Procedure Instructions (Addendum)
Melanie [name]  01/14/2016      [name] [name] [facility] [address] [name] [address] Phone: [phone] Fax: [phone]    Your procedure is scheduled on Monday, February 20.  Report to [facility] Admitting at 5:30 A.M.              Your surgery or procedure is scheduled for 7:30AM   Call this number if you have problems the morning of [phone]               For any other questions, please call [phone], Monday - Friday 8 AM - 4 PM.   Remember:  Do not eat food or drink liquids after midnight Sunday, February 19.  Take these medicines the morning of surgery with A SIP OF WATER : None  STOP all herbel meds, nsaids (aleve,naproxen,advil,ibuprofen) starting TODAY including vitamins ,aspirin   Do not wear jewelry, make-up or nail polish.  Do not wear lotions, powders, or perfumes.    Do not shave 48 hours prior to surgery.   Do not bring valuables to the hospital.  [name] is not responsible for any belongings or valuables.  Contacts, dentures or bridgework may not be worn into surgery.  Leave your suitcase in the car.  After surgery it may be brought to your room.  For patients admitted to the hospital, discharge time will be determined by your treatment team.  Patients discharged the day of surgery will not be allowed to drive home.   Special instructions: - Special Instructions:  - Preparing for Surgery  Before surgery, you can play an important role.  Because skin is not sterile, your skin needs to be as free of germs as possible.  You can reduce the number of germs on you skin by washing with CHG (chlorahexidine gluconate) soap before surgery.  CHG is an antiseptic cleaner which kills germs and bonds with the skin to continue killing germs even after washing.  Please DO NOT use if you have an allergy to CHG or antibacterial soaps.  If your skin becomes reddened/irritated stop using the  CHG and inform your nurse when you arrive at Short Stay.  Do not shave (including legs and underarms) for at least 48 hours prior to the first CHG shower.  You may shave your face.  Please follow these instructions carefully:   1.  Shower with CHG Soap the night before surgery and the morning of Surgery.  2.  If you choose to wash your hair, wash your hair first as usual with your normal shampoo.  3.  After you shampoo, rinse your hair and body thoroughly to remove the Shampoo.  4.  Use CHG as you would any other liquid soap.  You can apply chg directly  to the skin and wash gently with scrungie or a clean washcloth.  5.  Apply the CHG Soap to your body ONLY FROM THE NECK DOWN.  Do not use on open wounds or open sores.  Avoid contact with your eyes ears, mouth and genitals (private parts).  Wash genitals (private parts)       with your normal soap.  6.  Wash thoroughly, paying special attention to the area where your surgery will be performed.  7.  Thoroughly rinse your body with warm water from the neck down.  8.  DO NOT shower/wash with your normal soap after using and rinsing off the CHG Soap.  9.  Pat yourself dry with a clean towel.            10.  Wear clean pajamas.            11.  Place clean sheets on your bed the night of your first shower and do not sleep with pets.  Day of Surgery  Do not apply any lotions/deodorants the morning of surgery.  Please wear clean clothes to the hospital/surgery center.  Please read over the following fact sheets that you were given. Pain Booklet, Coughing and Deep Breathing, Blood Transfusion Information, MRSA Information and Surgical Site Infection Prevention

## 2016-01-15 ENCOUNTER — Encounter (HOSPITAL_COMMUNITY): Payer: Self-pay

## 2016-01-15 ENCOUNTER — Encounter (HOSPITAL_COMMUNITY)
Admission: RE | Admit: 2016-01-15 | Discharge: 2016-01-15 | Disposition: A | Discharge status: Home / Self Care | Payer: Federal, State, Local not specified - PPO | Source: Ambulatory Visit | Attending: Neurosurgery | Admitting: Neurosurgery

## 2016-01-15 DIAGNOSIS — Z01812 Encounter for preprocedural laboratory examination: Secondary | ICD-10-CM | POA: Diagnosis not present

## 2016-01-15 DIAGNOSIS — Z0183 Encounter for blood typing: Secondary | ICD-10-CM | POA: Insufficient documentation

## 2016-01-15 DIAGNOSIS — M4806 Spinal stenosis, lumbar region: Secondary | ICD-10-CM | POA: Diagnosis not present

## 2016-01-15 HISTORY — DX: Anemia, unspecified: D64.9

## 2016-01-15 LAB — CBC
HEMATOCRIT: 39.6 % (ref 36.0–46.0)
HEMOGLOBIN: 12.6 g/dL (ref 12.0–15.0)
MCH: 27.6 pg (ref 26.0–34.0)
MCHC: 31.8 g/dL (ref 30.0–36.0)
MCV: 86.7 fL (ref 78.0–100.0)
Platelets: 198 10*3/uL (ref 150–400)
RBC: 4.57 MIL/uL (ref 3.87–5.11)
RDW: 13.3 % (ref 11.5–15.5)
WBC: 10.3 10*3/uL (ref 4.0–10.5)

## 2016-01-15 LAB — TYPE AND SCREEN
ABO/RH(D): O NEG
Antibody Screen: NEGATIVE

## 2016-01-15 LAB — HCG, SERUM, QUALITATIVE: Preg, Serum: NEGATIVE

## 2016-01-15 LAB — SURGICAL PCR SCREEN
MRSA, PCR: NEGATIVE
Staphylococcus aureus: NEGATIVE

## 2016-01-15 LAB — ABO/RH: ABO/RH(D): O NEG

## 2016-01-20 MED ORDER — CEFAZOLIN SODIUM-DEXTROSE 2-3 GM-% IV SOLR
2.0000 g | INTRAVENOUS | Status: AC
  Administered 2016-01-21 (×2): 2 g via INTRAVENOUS
  Filled 2016-01-20: qty 50

## 2016-01-21 ENCOUNTER — Inpatient Hospital Stay (HOSPITAL_COMMUNITY)
Admission: RE | Admit: 2016-01-21 | Discharge: 2016-01-22 | DRG: 458 | Disposition: A | Discharge status: Home / Self Care | Payer: Federal, State, Local not specified - PPO | Source: Ambulatory Visit | Attending: Neurosurgery | Admitting: Neurosurgery

## 2016-01-21 ENCOUNTER — Encounter (HOSPITAL_COMMUNITY)
Admission: RE | Disposition: A | Discharge status: Home / Self Care | Payer: Self-pay | Source: Ambulatory Visit | Attending: Neurosurgery

## 2016-01-21 ENCOUNTER — Inpatient Hospital Stay (HOSPITAL_COMMUNITY): Payer: Federal, State, Local not specified - PPO | Admitting: Anesthesiology

## 2016-01-21 ENCOUNTER — Inpatient Hospital Stay (HOSPITAL_COMMUNITY): Payer: Federal, State, Local not specified - PPO

## 2016-01-21 ENCOUNTER — Encounter (HOSPITAL_COMMUNITY): Payer: Self-pay | Admitting: *Deleted

## 2016-01-21 ENCOUNTER — Observation Stay (HOSPITAL_COMMUNITY): Payer: Federal, State, Local not specified - PPO

## 2016-01-21 DIAGNOSIS — M419 Scoliosis, unspecified: Principal | ICD-10-CM | POA: Diagnosis present

## 2016-01-21 DIAGNOSIS — Z419 Encounter for procedure for purposes other than remedying health state, unspecified: Secondary | ICD-10-CM

## 2016-01-21 DIAGNOSIS — M4316 Spondylolisthesis, lumbar region: Secondary | ICD-10-CM | POA: Diagnosis present

## 2016-01-21 DIAGNOSIS — M48062 Spinal stenosis, lumbar region with neurogenic claudication: Secondary | ICD-10-CM | POA: Diagnosis present

## 2016-01-21 DIAGNOSIS — E669 Obesity, unspecified: Secondary | ICD-10-CM | POA: Diagnosis present

## 2016-01-21 DIAGNOSIS — M4806 Spinal stenosis, lumbar region: Secondary | ICD-10-CM | POA: Diagnosis present

## 2016-01-21 DIAGNOSIS — M5136 Other intervertebral disc degeneration, lumbar region: Secondary | ICD-10-CM | POA: Diagnosis present

## 2016-01-21 DIAGNOSIS — M5137 Other intervertebral disc degeneration, lumbosacral region: Secondary | ICD-10-CM | POA: Diagnosis present

## 2016-01-21 DIAGNOSIS — M4726 Other spondylosis with radiculopathy, lumbar region: Secondary | ICD-10-CM | POA: Diagnosis present

## 2016-01-21 DIAGNOSIS — Z6835 Body mass index (BMI) 35.0-35.9, adult: Secondary | ICD-10-CM

## 2016-01-21 SURGERY — POSTERIOR LUMBAR FUSION 2 LEVEL
Anesthesia: General | Site: Back

## 2016-01-21 MED ORDER — MENTHOL 3 MG MT LOZG: 1.0000 | LOZENGE | OROMUCOSAL | Status: DC | PRN

## 2016-01-21 MED ORDER — SODIUM CHLORIDE 0.9% FLUSH: 3.0000 mL | INTRAVENOUS | Status: DC | PRN

## 2016-01-21 MED ORDER — VECURONIUM BROMIDE 10 MG IV SOLR
INTRAVENOUS | Status: DC | PRN
  Administered 2016-01-21 (×2): 2 mg via INTRAVENOUS
  Administered 2016-01-21 (×2): 1 mg via INTRAVENOUS

## 2016-01-21 MED ORDER — ZOLPIDEM TARTRATE 5 MG PO TABS: 5.0000 mg | ORAL_TABLET | Freq: Every evening | ORAL | Status: DC | PRN

## 2016-01-21 MED ORDER — ALBUMIN HUMAN 5 % IV SOLN
INTRAVENOUS | Status: DC | PRN
  Administered 2016-01-21: 12:00:00 via INTRAVENOUS

## 2016-01-21 MED ORDER — ROCURONIUM BROMIDE 50 MG/5ML IV SOLN
INTRAVENOUS | Status: AC
  Filled 2016-01-21: qty 1

## 2016-01-21 MED ORDER — HYDROMORPHONE HCL 1 MG/ML IJ SOLN
0.2500 mg | INTRAMUSCULAR | Status: DC | PRN
  Administered 2016-01-21: 0.5 mg via INTRAVENOUS

## 2016-01-21 MED ORDER — KETOROLAC TROMETHAMINE 30 MG/ML IJ SOLN
INTRAMUSCULAR | Status: AC
  Filled 2016-01-21: qty 1

## 2016-01-21 MED ORDER — LACTATED RINGERS IV SOLN
INTRAVENOUS | Status: DC | PRN
  Administered 2016-01-21 (×3): via INTRAVENOUS

## 2016-01-21 MED ORDER — ARTIFICIAL TEARS OP OINT
TOPICAL_OINTMENT | OPHTHALMIC | Status: AC
  Filled 2016-01-21: qty 3.5

## 2016-01-21 MED ORDER — ROCURONIUM BROMIDE 100 MG/10ML IV SOLN
INTRAVENOUS | Status: DC | PRN
  Administered 2016-01-21: 50 mg via INTRAVENOUS

## 2016-01-21 MED ORDER — NEOSTIGMINE METHYLSULFATE 10 MG/10ML IV SOLN
INTRAVENOUS | Status: DC | PRN
  Administered 2016-01-21: 4 mg via INTRAVENOUS

## 2016-01-21 MED ORDER — ONDANSETRON HCL 4 MG/2ML IJ SOLN
INTRAMUSCULAR | Status: AC
  Filled 2016-01-21: qty 2

## 2016-01-21 MED ORDER — THROMBIN 20000 UNITS EX SOLR
CUTANEOUS | Status: DC | PRN
  Administered 2016-01-21: 10:00:00 via TOPICAL

## 2016-01-21 MED ORDER — DEXAMETHASONE SODIUM PHOSPHATE 10 MG/ML IJ SOLN
INTRAMUSCULAR | Status: DC | PRN
  Administered 2016-01-21: 10 mg via INTRAVENOUS

## 2016-01-21 MED ORDER — FENTANYL CITRATE (PF) 100 MCG/2ML IJ SOLN
INTRAMUSCULAR | Status: DC | PRN
  Administered 2016-01-21 (×2): 50 ug via INTRAVENOUS
  Administered 2016-01-21 (×3): 100 ug via INTRAVENOUS
  Administered 2016-01-21: 50 ug via INTRAVENOUS
  Administered 2016-01-21: 100 ug via INTRAVENOUS

## 2016-01-21 MED ORDER — BUPIVACAINE HCL (PF) 0.5 % IJ SOLN
INTRAMUSCULAR | Status: DC | PRN
  Administered 2016-01-21: 40 mL

## 2016-01-21 MED ORDER — MORPHINE SULFATE (PF) 4 MG/ML IV SOLN
4.0000 mg | INTRAVENOUS | Status: DC | PRN
  Administered 2016-01-21: 4 mg via INTRAMUSCULAR
  Filled 2016-01-21: qty 1

## 2016-01-21 MED ORDER — FENTANYL CITRATE (PF) 250 MCG/5ML IJ SOLN
INTRAMUSCULAR | Status: AC
  Filled 2016-01-21: qty 5

## 2016-01-21 MED ORDER — LIDOCAINE HCL (CARDIAC) 20 MG/ML IV SOLN
INTRAVENOUS | Status: AC
  Filled 2016-01-21: qty 5

## 2016-01-21 MED ORDER — OXYCODONE HCL 5 MG/5ML PO SOLN: 5.0000 mg | Freq: Once | ORAL | Status: DC | PRN

## 2016-01-21 MED ORDER — HYDROXYZINE HCL 25 MG PO TABS: 50.0000 mg | ORAL_TABLET | ORAL | Status: DC | PRN

## 2016-01-21 MED ORDER — PROMETHAZINE HCL 25 MG/ML IJ SOLN: 6.2500 mg | INTRAMUSCULAR | Status: DC | PRN

## 2016-01-21 MED ORDER — ACETAMINOPHEN 325 MG PO TABS: 650.0000 mg | ORAL_TABLET | ORAL | Status: DC | PRN

## 2016-01-21 MED ORDER — HYDROMORPHONE HCL 1 MG/ML IJ SOLN
INTRAMUSCULAR | Status: AC
  Filled 2016-01-21: qty 1

## 2016-01-21 MED ORDER — ATROPINE SULFATE 0.1 MG/ML IJ SOLN
INTRAMUSCULAR | Status: AC
  Filled 2016-01-21: qty 10

## 2016-01-21 MED ORDER — MIDAZOLAM HCL 2 MG/2ML IJ SOLN
INTRAMUSCULAR | Status: AC
  Filled 2016-01-21: qty 2

## 2016-01-21 MED ORDER — EPHEDRINE SULFATE 50 MG/ML IJ SOLN
INTRAMUSCULAR | Status: AC
  Filled 2016-01-21: qty 1

## 2016-01-21 MED ORDER — ARTIFICIAL TEARS OP OINT
TOPICAL_OINTMENT | OPHTHALMIC | Status: DC | PRN
  Administered 2016-01-21: 1 via OPHTHALMIC

## 2016-01-21 MED ORDER — ACETAMINOPHEN 10 MG/ML IV SOLN
INTRAVENOUS | Status: AC
  Administered 2016-01-21: 1000 mg via INTRAVENOUS
  Filled 2016-01-21: qty 100

## 2016-01-21 MED ORDER — PHENOL 1.4 % MT LIQD: 1.0000 | OROMUCOSAL | Status: DC | PRN

## 2016-01-21 MED ORDER — KCL IN DEXTROSE-NACL 20-5-0.45 MEQ/L-%-% IV SOLN
INTRAVENOUS | Status: DC
  Administered 2016-01-21: 16:00:00 via INTRAVENOUS
  Filled 2016-01-21: qty 1000

## 2016-01-21 MED ORDER — SODIUM CHLORIDE 0.9% FLUSH
3.0000 mL | Freq: Two times a day (BID) | INTRAVENOUS | Status: DC
  Administered 2016-01-21 – 2016-01-22 (×2): 3 mL via INTRAVENOUS

## 2016-01-21 MED ORDER — ONDANSETRON HCL 4 MG/2ML IJ SOLN: 4.0000 mg | Freq: Four times a day (QID) | INTRAMUSCULAR | Status: DC | PRN

## 2016-01-21 MED ORDER — BISACODYL 10 MG RE SUPP
10.0000 mg | Freq: Every day | RECTAL | Status: DC | PRN
Start: 2016-01-21 — End: 2016-01-22

## 2016-01-21 MED ORDER — THROMBIN 5000 UNITS EX SOLR
OROMUCOSAL | Status: DC | PRN
  Administered 2016-01-21: 12:00:00 via TOPICAL

## 2016-01-21 MED ORDER — LIDOCAINE HCL (CARDIAC) 20 MG/ML IV SOLN
INTRAVENOUS | Status: DC | PRN
  Administered 2016-01-21: 100 mg via INTRAVENOUS

## 2016-01-21 MED ORDER — KETOROLAC TROMETHAMINE 30 MG/ML IJ SOLN
30.0000 mg | Freq: Four times a day (QID) | INTRAMUSCULAR | Status: DC
  Administered 2016-01-21 – 2016-01-22 (×4): 30 mg via INTRAVENOUS
  Filled 2016-01-21 (×4): qty 1

## 2016-01-21 MED ORDER — OXYCODONE-ACETAMINOPHEN 5-325 MG PO TABS
1.0000 | ORAL_TABLET | ORAL | Status: DC | PRN
  Administered 2016-01-21 – 2016-01-22 (×4): 2 via ORAL
  Filled 2016-01-21 (×4): qty 2

## 2016-01-21 MED ORDER — CEFAZOLIN SODIUM-DEXTROSE 2-3 GM-% IV SOLR
INTRAVENOUS | Status: AC
  Filled 2016-01-21: qty 50

## 2016-01-21 MED ORDER — HYDROXYZINE HCL 50 MG/ML IM SOLN
50.0000 mg | INTRAMUSCULAR | Status: DC | PRN
  Administered 2016-01-21: 50 mg via INTRAMUSCULAR
  Filled 2016-01-21: qty 1

## 2016-01-21 MED ORDER — SODIUM CHLORIDE 0.9 % IV SOLN
INTRAVENOUS | Status: DC | PRN
  Administered 2016-01-21: 13:00:00 via INTRAVENOUS

## 2016-01-21 MED ORDER — HYDROCODONE-ACETAMINOPHEN 5-325 MG PO TABS: 1.0000 | ORAL_TABLET | ORAL | Status: DC | PRN

## 2016-01-21 MED ORDER — NEOSTIGMINE METHYLSULFATE 10 MG/10ML IV SOLN
INTRAVENOUS | Status: AC
  Filled 2016-01-21: qty 1

## 2016-01-21 MED ORDER — KETOROLAC TROMETHAMINE 30 MG/ML IJ SOLN
30.0000 mg | Freq: Once | INTRAMUSCULAR | Status: AC
  Administered 2016-01-21: 30 mg via INTRAVENOUS

## 2016-01-21 MED ORDER — MAGNESIUM HYDROXIDE 400 MG/5ML PO SUSP: 30.0000 mL | Freq: Every day | ORAL | Status: DC | PRN

## 2016-01-21 MED ORDER — PROPOFOL 10 MG/ML IV BOLUS
INTRAVENOUS | Status: AC
  Filled 2016-01-21: qty 20

## 2016-01-21 MED ORDER — MIDAZOLAM HCL 5 MG/5ML IJ SOLN
INTRAMUSCULAR | Status: DC | PRN
  Administered 2016-01-21: 2 mg via INTRAVENOUS

## 2016-01-21 MED ORDER — 0.9 % SODIUM CHLORIDE (POUR BTL) OPTIME
TOPICAL | Status: DC | PRN
  Administered 2016-01-21 (×3): 1000 mL

## 2016-01-21 MED ORDER — ONDANSETRON HCL 4 MG PO TABS
4.0000 mg | ORAL_TABLET | Freq: Four times a day (QID) | ORAL | Status: DC | PRN
  Administered 2016-01-21 – 2016-01-22 (×2): 4 mg via ORAL
  Filled 2016-01-21 (×2): qty 1

## 2016-01-21 MED ORDER — ACETAMINOPHEN 650 MG RE SUPP
650.0000 mg | RECTAL | Status: DC | PRN
Start: 2016-01-21 — End: 2016-01-22

## 2016-01-21 MED ORDER — ALUM & MAG HYDROXIDE-SIMETH 200-200-20 MG/5ML PO SUSP: 30.0000 mL | Freq: Four times a day (QID) | ORAL | Status: DC | PRN

## 2016-01-21 MED ORDER — SODIUM CHLORIDE 0.9 % IR SOLN
Status: DC | PRN
  Administered 2016-01-21: 08:00:00

## 2016-01-21 MED ORDER — PROPOFOL 10 MG/ML IV BOLUS
INTRAVENOUS | Status: DC | PRN
  Administered 2016-01-21: 120 mg via INTRAVENOUS
  Administered 2016-01-21: 80 mg via INTRAVENOUS

## 2016-01-21 MED ORDER — DEXAMETHASONE SODIUM PHOSPHATE 10 MG/ML IJ SOLN
INTRAMUSCULAR | Status: AC
  Filled 2016-01-21: qty 1

## 2016-01-21 MED ORDER — CYCLOBENZAPRINE HCL 10 MG PO TABS
10.0000 mg | ORAL_TABLET | Freq: Three times a day (TID) | ORAL | Status: DC | PRN
  Administered 2016-01-21: 10 mg via ORAL
  Filled 2016-01-21: qty 1

## 2016-01-21 MED ORDER — OXYCODONE HCL 5 MG PO TABS: 10.0000 mg | ORAL_TABLET | Freq: Once | ORAL | Status: DC | PRN

## 2016-01-21 MED ORDER — SUCCINYLCHOLINE CHLORIDE 20 MG/ML IJ SOLN
INTRAMUSCULAR | Status: AC
  Filled 2016-01-21: qty 1

## 2016-01-21 MED ORDER — GLYCOPYRROLATE 0.2 MG/ML IJ SOLN
INTRAMUSCULAR | Status: AC
  Filled 2016-01-21: qty 3

## 2016-01-21 MED ORDER — LIDOCAINE-EPINEPHRINE 1 %-1:100000 IJ SOLN
INTRAMUSCULAR | Status: DC | PRN
  Administered 2016-01-21: 40 mL

## 2016-01-21 MED ORDER — SODIUM CHLORIDE 0.9 % IJ SOLN
INTRAMUSCULAR | Status: AC
  Filled 2016-01-21: qty 10

## 2016-01-21 MED ORDER — GLYCOPYRROLATE 0.2 MG/ML IJ SOLN
INTRAMUSCULAR | Status: DC | PRN
  Administered 2016-01-21: 0.6 mg via INTRAVENOUS

## 2016-01-21 MED ORDER — ONDANSETRON HCL 4 MG/2ML IJ SOLN
INTRAMUSCULAR | Status: DC | PRN
  Administered 2016-01-21: 4 mg via INTRAVENOUS

## 2016-01-21 MED ORDER — VECURONIUM BROMIDE 10 MG IV SOLR
INTRAVENOUS | Status: AC
  Filled 2016-01-21: qty 10

## 2016-01-21 SURGICAL SUPPLY — 88 items
ADH SKN CLS APL DERMABOND .7 (GAUZE/BANDAGES/DRESSINGS) ×2
APL SKNCLS STERI-STRIP NONHPOA (GAUZE/BANDAGES/DRESSINGS)
BAG DECANTER FOR FLEXI CONT (MISCELLANEOUS) ×2 IMPLANT
BENZOIN TINCTURE PRP APPL 2/3 (GAUZE/BANDAGES/DRESSINGS) IMPLANT
BLADE CLIPPER SURG (BLADE) IMPLANT
BRUSH SCRUB EZ PLAIN DRY (MISCELLANEOUS) ×2 IMPLANT
BUR ACRON 5.0MM COATED (BURR) ×3 IMPLANT
BUR MATCHSTICK NEURO 3.0 LAGG (BURR) ×2 IMPLANT
CANISTER SUCT 3000ML PPV (MISCELLANEOUS) ×2 IMPLANT
CAP LCK SPNE (Orthopedic Implant) ×6 IMPLANT
CAP LOCK SPINE RADIUS (Orthopedic Implant) IMPLANT
CAP LOCKING (Orthopedic Implant) ×12 IMPLANT
CONT SPEC 4OZ CLIKSEAL STRL BL (MISCELLANEOUS) ×2 IMPLANT
COVER BACK TABLE 60X90IN (DRAPES) ×2 IMPLANT
CROSSLINK MEDIUM (Orthopedic Implant) ×1 IMPLANT
DERMABOND ADVANCED (GAUZE/BANDAGES/DRESSINGS) ×2
DERMABOND ADVANCED .7 DNX12 (GAUZE/BANDAGES/DRESSINGS) ×2 IMPLANT
DRAPE C-ARM 42X72 X-RAY (DRAPES) ×5 IMPLANT
DRAPE LAPAROTOMY 100X72X124 (DRAPES) ×2 IMPLANT
DRAPE POUCH INSTRU U-SHP 10X18 (DRAPES) ×2 IMPLANT
DRAPE PROXIMA HALF (DRAPES) ×2 IMPLANT
DRSG EMULSION OIL 3X3 NADH (GAUZE/BANDAGES/DRESSINGS) IMPLANT
ELECT REM PT RETURN 9FT ADLT (ELECTROSURGICAL) ×2
ELECTRODE REM PT RTRN 9FT ADLT (ELECTROSURGICAL) ×1 IMPLANT
GAUZE SPONGE 4X4 12PLY STRL (GAUZE/BANDAGES/DRESSINGS) ×2 IMPLANT
GAUZE SPONGE 4X4 16PLY XRAY LF (GAUZE/BANDAGES/DRESSINGS) ×2 IMPLANT
GLOVE BIO SURGEON STRL SZ7 (GLOVE) ×2 IMPLANT
GLOVE BIO SURGEON STRL SZ8 (GLOVE) ×2 IMPLANT
GLOVE BIOGEL PI IND STRL 8 (GLOVE) ×2 IMPLANT
GLOVE BIOGEL PI INDICATOR 8 (GLOVE) ×2
GLOVE ECLIPSE 7.5 STRL STRAW (GLOVE) ×5 IMPLANT
GLOVE EXAM NITRILE LRG STRL (GLOVE) IMPLANT
GLOVE EXAM NITRILE MD LF STRL (GLOVE) IMPLANT
GLOVE EXAM NITRILE XL STR (GLOVE) IMPLANT
GLOVE EXAM NITRILE XS STR PU (GLOVE) IMPLANT
GLOVE INDICATOR 7.0 STRL GRN (GLOVE) ×2 IMPLANT
GLOVE INDICATOR 7.5 STRL GRN (GLOVE) ×5 IMPLANT
GLOVE SURG SS PI 6.5 STRL IVOR (GLOVE) ×3 IMPLANT
GOWN STRL REUS W/ TWL LRG LVL3 (GOWN DISPOSABLE) IMPLANT
GOWN STRL REUS W/ TWL XL LVL3 (GOWN DISPOSABLE) ×2 IMPLANT
GOWN STRL REUS W/TWL 2XL LVL3 (GOWN DISPOSABLE) IMPLANT
GOWN STRL REUS W/TWL LRG LVL3 (GOWN DISPOSABLE) ×6
GOWN STRL REUS W/TWL XL LVL3 (GOWN DISPOSABLE) ×6
HEMOSTAT POWDER SURGIFOAM 1G (HEMOSTASIS) ×1 IMPLANT
KIT BASIN OR (CUSTOM PROCEDURE TRAY) ×2 IMPLANT
KIT INFUSE MEDIUM (Orthopedic Implant) ×1 IMPLANT
KIT ROOM TURNOVER OR (KITS) ×2 IMPLANT
MILL MEDIUM DISP (BLADE) ×1 IMPLANT
NDL 18GX1X1/2 (RX/OR ONLY) (NEEDLE) ×1 IMPLANT
NDL ASP BONE MRW 8GX15 (NEEDLE) IMPLANT
NDL SPNL 18GX3.5 QUINCKE PK (NEEDLE) ×1 IMPLANT
NDL SPNL 22GX3.5 QUINCKE BK (NEEDLE) ×1 IMPLANT
NEEDLE 18GX1X1/2 (RX/OR ONLY) (NEEDLE) ×2 IMPLANT
NEEDLE ASP BONE MRW 8GX15 (NEEDLE) ×2 IMPLANT
NEEDLE BONE MARROW 8GAX6 (NEEDLE) ×1 IMPLANT
NEEDLE SPNL 18GX3.5 QUINCKE PK (NEEDLE) ×2 IMPLANT
NEEDLE SPNL 22GX3.5 QUINCKE BK (NEEDLE) ×2 IMPLANT
NS IRRIG 1000ML POUR BTL (IV SOLUTION) ×3 IMPLANT
PACK LAMINECTOMY NEURO (CUSTOM PROCEDURE TRAY) ×2 IMPLANT
PAD ARMBOARD 7.5X6 YLW CONV (MISCELLANEOUS) ×6 IMPLANT
PATTIES SURGICAL .5 X.5 (GAUZE/BANDAGES/DRESSINGS) IMPLANT
PATTIES SURGICAL .5 X1 (DISPOSABLE) IMPLANT
PATTIES SURGICAL 1X1 (DISPOSABLE) ×1 IMPLANT
PEEK PLIF AVS 10X25X4 (Peek) ×2 IMPLANT
PEEK PLIF AVS 13X25X8 (Peek) ×2 IMPLANT
SCREW 5.75X40M (Screw) ×4 IMPLANT
SCREW 5.75X45MM (Screw) ×2 IMPLANT
SPONGE LAP 4X18 X RAY DECT (DISPOSABLE) ×1 IMPLANT
SPONGE NEURO XRAY DETECT 1X3 (DISPOSABLE) ×2 IMPLANT
SPONGE SURGIFOAM ABS GEL 100 (HEMOSTASIS) ×1 IMPLANT
STAPLER SKIN PROX WIDE 3.9 (STAPLE) IMPLANT
STRIP BIOACTIVE VITOSS 25X100X (Neuro Prosthesis/Implant) ×2 IMPLANT
STRIP CLOSURE SKIN 1/2X4 (GAUZE/BANDAGES/DRESSINGS) IMPLANT
SUT PROLENE 6 0 BV (SUTURE) IMPLANT
SUT VIC AB 1 CT1 18XBRD ANBCTR (SUTURE) ×2 IMPLANT
SUT VIC AB 1 CT1 8-18 (SUTURE) ×6
SUT VIC AB 2-0 CP2 18 (SUTURE) ×5 IMPLANT
SUT VIC AB 3-0 SH 8-18 (SUTURE) ×1 IMPLANT
SYR 3ML LL SCALE MARK (SYRINGE) ×8 IMPLANT
SYR 5ML LL (SYRINGE) IMPLANT
SYR BULB IRRIGATION 50ML (SYRINGE) ×1 IMPLANT
SYR CONTROL 10ML LL (SYRINGE) ×2 IMPLANT
TAPE CLOTH SURG 4X10 WHT LF (GAUZE/BANDAGES/DRESSINGS) ×1 IMPLANT
TOWEL OR 17X24 6PK STRL BLUE (TOWEL DISPOSABLE) ×2 IMPLANT
TOWEL OR 17X26 10 PK STRL BLUE (TOWEL DISPOSABLE) ×2 IMPLANT
TRAP SPECIMEN MUCOUS 40CC (MISCELLANEOUS) ×1 IMPLANT
TRAY FOLEY W/METER SILVER 14FR (SET/KITS/TRAYS/PACK) ×2 IMPLANT
WATER STERILE IRR 1000ML POUR (IV SOLUTION) ×2 IMPLANT

## 2016-01-21 NOTE — Op Note (Signed)
01/21/2016  1:46 PM  PATIENT:  Melanie [name]  35 y.o. 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, lumbar scoliosis, bilateral L4-5 lumbar disc herniation, lumbar stenosis, lumbar spondylosis, lumbar degenerative disc disease, right lumbar radiculopathy  PROCEDURE:  Procedure(s):  Bilateral L4, L5, and S1 decompressive lumbar laminectomy, bilateral L4-5 and L5-S1 facetectomy, and foraminotomies for decompression of the stenotic compression of the exiting L4, L5, and S1 nerve roots bilaterally, with decompression beyond that required for interbody arthrodesis; bilateral L4-5 and L5-S1 lumbar discectomy; bilateral L4-5 and L5-S1 posterior lumbar interbody arthrodesis with AVS peek interbody implants, Vitoss BA with bone marrow aspirate, and infuse; bilateral L4-S1 posterior lateral arthrodesis with segmental radius posterior instrumentation, locally harvested morcellized autograft, Vitoss BA with bone marrow aspirate, and infuse   SURGEON:  Surgeon(s): Shirlean Kelly, MD Donalee Citrin, MD  ASSISTANTS: Donalee Citrin, M.D.  ANESTHESIA:   general  EBL:  Total I/O In: 3010 [I.V.:2650; Blood:110; IV Piggyback:250] Out: 575 [Urine:225; Blood:350]  BLOOD ADMINISTERED:110 CC CELLSAVER  COUNT: Correct per nursing staff  DICTATION: Patient was brought to the operating room placed under general endotracheal anesthesia. The patient was turned to prone position, the lumbar region was prepped with Betadine soap and solution and draped in a sterile fashion. The midline was infiltrated with local anesthesia with epinephrine. A localizing x-ray was taken and then a midline incision was made and carried down through the subcutaneous tissue,  bipolar cautery and electrocautery were used to maintain hemostasis. Dissection was carried down to the lumbar fascia. The fascia was incised bilaterally and the paraspinal muscles were dissected with a spinous process and lamina in a subperiosteal fashion. Another x-ray was taken for localization and the L4, L5, and S1 levels were localized. Dissection was then carried out laterally over the facet complexes and the transverse processes of L4 and L5 and the ala of S1 were exposed and decorticated.   Decompression was begun via bilateral L4, L5, and S1 decompressive lumbar laminectomy, using double-action rongeurs, the high-speed drill, and Kerrison punches. The ligamentum flavum which was thickened was carefully removed decompressing the spinal canal and thecal sac. We then extended our decompression laterally performing bilateral L4-5 and L5-S1 facetectomies. Marked L4-5 facet arthropathy was noted. Foraminotomies were performed for the stenotic compression of the exiting L4, L5, and S1 nerve roots.  We then began the bilateral discectomy at the L4-5 and L5-S1 levels. Epidural veins overlying the annulus at each level were coagulated and divided, and then the annulus was incised, and the disc space entered bilaterally at each level. A thorough discectomy was performed using a variety of pituitary rongeurs and micro-curettes. Particular attention was paid to the foraminal/extraforaminal disc herniation is located bilaterally at L4-5, worse on the right than the left side. A free fragment disc herniation was mobilized from the right L4-5 foraminal/extraforaminal space, decompress the exiting right L4 nerve root. Once the discectomy was completed and the decompression completed we proceeded with the interbody arthrodesis.  We prepared the endplate surfaces, removing the cartilaginous endplates. We then measured the height of the intervertebral disc space. We selected 13 x 25 x 8 AVS peek interbody implants for  the L4-5 level, and 10 x 25 x 4 AVS peek interbody implants for the L5-S1 level.  The C-arm fluoroscope was then draped and brought in the  field and we identified the pedicle entry points bilaterally at the L4, L5, and S1 levels. Each of the 6 pedicles was probed, we aspirated bone marrow aspirate from the vertebral bodies, this was injected over two 10 cc strips of Vitoss BA. Then each of the pedicles was examined with the ball probe, good bony surfaces were found and no bony cuts were found. Each of the pedicles was then tapped with a 5.25 mm tap, again examined with the ball probe good threading was found and no bony cuts were found. We then placed 5.75 by 45 millimeter screws bilaterally at the L4 level, 5.75 by 40 millimeter screws bilaterally at the L5 level, and 5.75 by 40 millimeter screws bilaterally at the S1 level.  We then packed the AVS peek interbody implants with Vitoss BA with bone marrow aspirate and infuse, and then placed the first implant at the L4-5 level on the right side, carefully retracting the thecal sac and nerve root medially. We then went back to the left side and packed the midline with additional Vitoss BA with bone marrow aspirate and infuse, and then placed a second implant on the left side again retracting the thecal sac and nerve root medially. Additional Vitoss BA with bone marrow aspirate and infuse was packed lateral to the implants.  Then at the L5-S1 level, we placed the first implant on the right side, carefully retracting the thecal sac and nerve root medially. We then went back to the left side, packed the left lateral intradiscal space with Vitoss BA with bone marrow aspirate and infuse, and then placed a second implant on the left side, again retracting the thecal sac and nerve root medially.   We then packed the lateral gutter over the transverse processes and intertransverse space with locally harvested morcellized autograft, Vitoss BA with bone marrow aspirate,  and infuse. We then selected 80 mm straight rods, they were placed within the screw heads and secured with locking caps once all 6 locking caps were placed final tightening was performed against a counter torque.  A variable medium cross connector was placed, securing it to the rods between the L5 and S1 screws bilaterally. Once it was locked to the rods, the central locking ring was tightened down.  The wound had been irrigated multiple times during the procedure with saline solution and bacitracin solution, good hemostasis was established with a combination of bipolar cautery and Gelfoam with thrombin. Once good hemostasis was confirmed a thin layer of Surgifoam applied in the epidural space, and we then proceeded with closure. The paraspinal muscles deep fascia and Scarpa's fascia were closed with interrupted undyed 1 Vicryl sutures the subcutaneous and subcuticular closed with interrupted inverted 2-0 and 3-0 undyed Vicryl sutures the skin edges were approximated with Dermabond. The wound was dressed with sterile gauze and Hypafix.  Following surgery the patient was turned back to the supine position to be reversed and the anesthetic extubated and transferred to the recovery room for further care.    PLAN OF CARE: Admit for overnight observation  PATIENT DISPOSITION:  PACU - hemodynamically stable.   Delay start of Pharmacological VTE agent (>24hrs) due to surgical blood loss or risk of bleeding:  yes

## 2016-01-21 NOTE — Progress Notes (Signed)
Filed Vitals:   01/21/16 1457 01/21/16 1500 01/21/16 1505 01/21/16 1525  BP:  119/67  148/78  Pulse: 89 91 109 97  Temp:   97.2 F (36.2 C) 97.9 F (36.6 C)  TempSrc:      Resp: Height:      Weight:      SpO2: 100% 100% 100% 100%    Patient resting in bed, but working with staffing preparing to ambulate.  Dressing clean and dry. Foley to straight drainage. Moving all 4 extremities well.  Plan: Encouraged to ambulate with a staff. Will DC Foley in early morning, and then monitor voiding function. We'll continue to progress through postoperative recovery.  Hewitt Shorts, MD 01/21/2016, 5:42 PM

## 2016-01-21 NOTE — H&P (Signed)
Subjective: Patient is a 35 y.o. right-handed black female who is admitted for treatment of advanced degeneration at the L4-5 and L5-S1 levels. She's had difficulties with pain in the right side of her low back, rating into the right lower extremity. Pain extends to the buttock, posterior thigh, and calf, but also into the right groin and anterior right thigh. X-rays reveal a grade 2 dynamic degenerative spondylolisthesis at L4-5, and advance degenerative disease and spondylosis at the L5-S1 level with marked disc space narrowing and ventral spurring. No pars defects are identified, it appears the spinal listhesis is due to bilateral L4-5 facet arthropathy. MRI reveals severe bilateral hypertrophic facet arthropathy, with widening of the joint corresponding to her grade 2 dynamic degenerative spondylolisthesis. There is bilateral foraminal and extraforaminal disc herniation at L4-5, right worse than left, with significant nerve root compression, right worse than left, corresponding to her right lumbar radiculopathy. There is also moderate canal stenosis. At L5-S1 there is advanced degenerative disc disease and spondylosis and disc space narrowing, with Modic changes and the endplates, and loss of disc space height. She's been treated with NSAIDs and spinal injections without relief. She is therefore now admitted for a bilateral L4-5 and L5-S1 decompression including laminectomy, facetectomy, foraminotomy, discectomy, and a bilateral L4-5 and L5-S1 stabilization with bilateral L4-5 and L5-S1 posterior lumbar interbody arthrodesis with interbody implants and bone graft, and a bilateral L4-S1 posterior lateral arthrodesis with posterior instrumentation and bone graft.   Patient Active Problem List   Diagnosis Date Noted  . Obesity (BMI 35.0-39.9 without comorbidity) (HCC) 08/18/2013   Past Medical History  Diagnosis Date  . Chicken pox chicken pox  . Ovarian cyst   . Fracture of arm     right  . Heart  murmur     no problems or tests  . Anemia     hx    Past Surgical History  Procedure Laterality Date  . Ovarian cystectomy Right     35 yrs old  . Cesarean section N/A 06/16/2013    Procedure: CESAREAN SECTION with left side tubal ligation;  Surgeon: [name] Bad, MD;  Location: [facility];  Service: Obstetrics;  Laterality: N/A;  left side tubal ligation, patient has no right side fallopian tube    Prescriptions prior to admission  Medication Sig Dispense Refill Last Dose  . ibuprofen (ADVIL,MOTRIN) 200 MG tablet Take 400 mg by mouth once.   Past Month at Unknown time   No Known Allergies  Social History  Substance Use Topics  . Smoking status: Never Smoker   . Smokeless tobacco: Never Used  . Alcohol Use: No    Family History  Problem Relation Age of Onset  . Cancer Neg Hx   . Stroke Neg Hx   . Diabetes Paternal Grandmother   . Hypertension Father      Review of Systems A comprehensive review of systems was negative.  Objective: Vital signs in last 24 hours: Temp:  [98.9 F (37.2 C)] 98.9 F (37.2 C) (02/20 0629) Pulse Rate:  [75] 75 (02/20 0629) Resp:  [18] 18 (02/20 0629) BP: (152)/(87) 152/87 mmHg (02/20 0629) SpO2:  [100 %] 100 % (02/20 0629) Weight:  [109.77 kg (242 lb)] 109.77 kg (242 lb) (02/20 0629)  EXAM: Patient is a well-developed well-nourished black female in no acute distress.  Lungs are clear to auscultation , the patient has symmetrical respiratory excursion. Heart has a regular rate and rhythm normal S1 and S2 no murmur.   Abdomen is  soft nontender nondistended bowel sounds are present. Extremity examination shows no clubbing cyanosis or edema. Motor examination shows 5 over 5 strength in the lower extremities including the iliopsoas quadriceps dorsiflexor extensor hallicus  longus and plantar flexor bilaterally. Sensation is intact to pinprick in the distal lower extremities. Reflexes are symmetrical bilaterally. No pathologic reflexes are present.  Patient has a normal gait and stance.   Data Review:CBC    Component Value Date/Time   WBC 10.3 01/15/2016 0932   RBC 4.57 01/15/2016 0932   HGB 12.6 01/15/2016 0932   HGB 12.6 12/08/2012   HCT 39.6 01/15/2016 0932   HCT 38 12/08/2012   PLT 198 01/15/2016 0932   PLT 181 12/08/2012   MCV 86.7 01/15/2016 0932   MCH 27.6 01/15/2016 0932   MCHC 31.8 01/15/2016 0932   RDW 13.3 01/15/2016 0932                          BMET    Component Value Date/Time   NA 138 06/16/2013 0654   K 4.1 06/16/2013 0654   CL 104 06/16/2013 0654   CO2 24 06/16/2013 0654   GLUCOSE 91 06/16/2013 0654   BUN 5* 06/16/2013 0654   CREATININE 0.89 06/16/2013 0654   CALCIUM 9.0 06/16/2013 0654   GFRNONAA 85* 06/16/2013 0654   GFRAA >90 06/16/2013 0654     Assessment/Plan: Patient with right-sided low back and right lumbar radicular pain, with advanced degeneration at the L4-5 and L5-S1 levels, who is admitted now for an L4-5 and L5-S1 lumbar decompression and stabilization.   I've discussed with the patient the nature of his condition, the nature the surgical procedure, the typical length of surgery, hospital stay, and overall recuperation, the limitations postoperatively, and risks of surgery. I discussed risks including risks of infection, bleeding, possibly need for transfusion, the risk of nerve root dysfunction with pain, weakness, numbness, or paresthesias, the risk of dural tear and CSF leakage and possible need for further surgery, the risk of failure of the arthrodesis and possibly for further surgery, the risk of anesthetic complications including myocardial infarction, stroke, pneumonia, and death. We discussed the need for postoperative immobilization in a lumbar brace. Understanding all this the patient does wish to proceed with surgery and is admitted for such.     Hewitt Shorts, MD 01/21/2016 7:22 AM

## 2016-01-21 NOTE — Anesthesia Postprocedure Evaluation (Signed)
Anesthesia Post Note  Patient: Melanie [name]  Procedure(s) Performed: Procedure(s) (LRB): Lumbar four-five Lumbar five-sacral one decompression with posterior lumbar interbody fusion with interbody prosthesis posterior lateral arthrodesis and posterior segmental instrumentation (N/A)  Patient location during evaluation: PACU Anesthesia Type: General Level of consciousness: awake and alert Pain management: pain level controlled Vital Signs Assessment: post-procedure vital signs reviewed and stable Respiratory status: spontaneous breathing, nonlabored ventilation, respiratory function stable and patient connected to nasal cannula oxygen Cardiovascular status: blood pressure returned to baseline and stable Postop Assessment: no signs of nausea or vomiting Anesthetic complications: no    Last Vitals:  Filed Vitals:   01/21/16 1500 01/21/16 1505  BP: 119/67   Pulse: 91 109  Temp:  36.2 C  Resp: 17 23    Last Pain:  Filed Vitals:   01/21/16 1505  PainSc: Asleep                 Reino Kent

## 2016-01-21 NOTE — Anesthesia Preprocedure Evaluation (Addendum)
Anesthesia Evaluation  Patient identified by MRN, date of birth, ID band Patient awake    Reviewed: Allergy & Precautions, H&P , NPO status , Patient's Chart, lab work & pertinent test results  History of Anesthesia Complications Negative for: history of anesthetic complications  Airway Mallampati: I  TM Distance: >3 FB Neck ROM: full    Dental no notable dental hx. (+) Teeth Intact   Pulmonary neg pulmonary ROS,    Pulmonary exam normal breath sounds clear to auscultation       Cardiovascular negative cardio ROS Normal cardiovascular exam Rhythm:regular Rate:Normal     Neuro/Psych negative neurological ROS     GI/Hepatic negative GI ROS, Neg liver ROS,   Endo/Other  Morbid obesity  Renal/GU negative Renal ROS     Musculoskeletal   Abdominal   Peds  Hematology negative hematology ROS (+)   Anesthesia Other Findings   Reproductive/Obstetrics negative OB ROS                            Anesthesia Physical Anesthesia Plan  ASA: III  Anesthesia Plan: General   Post-op Pain Management:    Induction: Intravenous  Airway Management Planned: Oral ETT  Additional Equipment: None  Intra-op Plan:   Post-operative Plan: Extubation in OR  Informed Consent: I have reviewed the patients History and Physical, chart, labs and discussed the procedure including the risks, benefits and alternatives for the proposed anesthesia with the patient or authorized representative who has indicated his/her understanding and acceptance.   Dental Advisory Given  Plan Discussed with: Anesthesiologist, CRNA and Surgeon  Anesthesia Plan Comments:         Anesthesia Quick Evaluation

## 2016-01-21 NOTE — Anesthesia Procedure Notes (Signed)
Procedure Name: Intubation Date/Time: 01/21/2016 7:34 AM Performed by: Roney Mans P Pre-anesthesia Checklist: Patient identified, Timeout performed, Emergency Drugs available, Suction available and Patient being monitored Patient Re-evaluated:Patient Re-evaluated prior to inductionOxygen Delivery Method: Circle system utilized Preoxygenation: Pre-oxygenation with 100% oxygen Intubation Type: IV induction Ventilation: Mask ventilation without difficulty Laryngoscope Size: Mac and 3 Grade View: Grade I Tube type: Oral Tube size: 7.0 mm Number of attempts: 1 Airway Equipment and Method: Stylet Placement Confirmation: ETT inserted through vocal cords under direct vision,  breath sounds checked- equal and bilateral and positive ETCO2 Secured at: 20 cm Tube secured with: Tape Dental Injury: Teeth and Oropharynx as per pre-operative assessment

## 2016-01-21 NOTE — Transfer of Care (Signed)
Immediate Anesthesia Transfer of Care Note  Patient: Melanie [name]  Procedure(s) Performed: Procedure(s): Lumbar four-five Lumbar five-sacral one decompression with posterior lumbar interbody fusion with interbody prosthesis posterior lateral arthrodesis and posterior segmental instrumentation (N/A)  Patient Location: PACU  Anesthesia Type:General  Level of Consciousness: awake, alert , oriented and patient cooperative  Airway & Oxygen Therapy: Patient Spontanous Breathing and Patient connected to nasal cannula oxygen  Post-op Assessment: Report given to RN, Post -op Vital signs reviewed and stable and Patient moving all extremities X 4  Post vital signs: Reviewed and stable  Last Vitals:  Filed Vitals:   01/21/16 0629 01/21/16 1343  BP: 152/87   Pulse: 75   Temp: 37.2 C 36.7 C  Resp: 18     Complications: No apparent anesthesia complications

## 2016-01-22 DIAGNOSIS — M419 Scoliosis, unspecified: Secondary | ICD-10-CM | POA: Diagnosis present

## 2016-01-22 DIAGNOSIS — M4806 Spinal stenosis, lumbar region: Secondary | ICD-10-CM | POA: Diagnosis present

## 2016-01-22 DIAGNOSIS — M5137 Other intervertebral disc degeneration, lumbosacral region: Secondary | ICD-10-CM | POA: Diagnosis present

## 2016-01-22 DIAGNOSIS — M5136 Other intervertebral disc degeneration, lumbar region: Secondary | ICD-10-CM | POA: Diagnosis present

## 2016-01-22 DIAGNOSIS — M4726 Other spondylosis with radiculopathy, lumbar region: Secondary | ICD-10-CM | POA: Diagnosis present

## 2016-01-22 DIAGNOSIS — Z6835 Body mass index (BMI) 35.0-35.9, adult: Secondary | ICD-10-CM | POA: Diagnosis not present

## 2016-01-22 DIAGNOSIS — E669 Obesity, unspecified: Secondary | ICD-10-CM | POA: Diagnosis present

## 2016-01-22 DIAGNOSIS — M4316 Spondylolisthesis, lumbar region: Secondary | ICD-10-CM | POA: Diagnosis present

## 2016-01-22 DIAGNOSIS — M545 Low back pain: Secondary | ICD-10-CM | POA: Diagnosis present

## 2016-01-22 MED ORDER — CYCLOBENZAPRINE HCL 10 MG PO TABS: 5.0000 mg | ORAL_TABLET | Freq: Three times a day (TID) | ORAL | Status: DC | PRN

## 2016-01-22 MED ORDER — OXYCODONE-ACETAMINOPHEN 5-325 MG PO TABS: 1.0000 | ORAL_TABLET | ORAL | Status: DC | PRN

## 2016-01-22 NOTE — Progress Notes (Signed)
Patient alert and oriented, mae's well, voiding adequate amount of urine, swallowing without difficulty, c/o moderate pain and meds given prior to discharged. Patient discharged home with family. Script and discharged instructions given to patient. Patient and family stated understanding of instructions given. Pt. Has an appointment with doctor for March 17th

## 2016-01-22 NOTE — Progress Notes (Signed)
Utilization review completed.  

## 2016-01-22 NOTE — Discharge Instructions (Signed)

## 2016-01-22 NOTE — Discharge Summary (Signed)
Physician Discharge Summary  Patient ID: Melanie [name] MRN: [id number] DOB/AGE: Mar 21, 1981 34 y.o.  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  Postoperatively she has done well.  She's been up and inability actively in the halls.  Her dressing was removed, and her incision is healing nicely.  Her Foley catheter was removed and she is voiding well.  She is being discharged home with instructions regarding wound care and activities she is to follow up with me in the office in 3 weeks, with x-rays.  Discharge Exam: Blood pressure 116/53, pulse 84, temperature 98.2 F (36.8 C), temperature source Oral, resp. rate 18, height 5' 9.5" (1.765 m), weight 109.77 kg (242 lb), last menstrual period 01/10/2016, SpO2 100 %.  Disposition: 01-Home or Self Care     Medication List    TAKE these medications        cyclobenzaprine 10 MG tablet  Commonly known as:  FLEXERIL  Take 0.5-1 tablets (5-10 mg total) by mouth 3 (three) times daily as needed for muscle spasms.     ibuprofen 200 MG tablet  Commonly known as:  ADVIL,MOTRIN  Take 400 mg by mouth once.     oxyCODONE-acetaminophen 5-325 MG tablet  Commonly known as:  PERCOCET/ROXICET  Take 1-2 tablets by mouth every 4 (four) hours as needed for moderate pain.           Follow-up  Information    Follow up with Hewitt Shorts, MD.   Specialty:  Neurosurgery   Contact information:   [address] [place] [address] [phone]       Signed: Hewitt Shorts 01/22/2016, 4:01 PM

## 2016-01-22 NOTE — Progress Notes (Signed)
Filed Vitals:   01/21/16 1525 01/21/16 2000 01/21/16 2330 01/22/16 0400  BP: 148/78 147/79 129/66 126/66  Pulse: 97 78 67 66  Temp: 97.9 F (36.6 C) 98.2 F (36.8 C) 98.6 F (37 C) 97.5 F (36.4 C)  TempSrc:  Oral Oral Oral  Resp: Height:      Weight:      SpO2: 100% 100% 99% 100%    Resting in bed, but has walked several times in the halls. Notes excellent relief of right lumbar radicular pain. Dressing clean and dry. Foley DC'd an hour and a half ago, nursing staff to monitor voiding function. Encourage patient to ambulate several more times today in the halls.  Plan: Continued to progress through postoperative recovery.  Hewitt Shorts, MD 01/22/2016, 7:20 AM

## 2016-01-23 MED FILL — Heparin Sodium (Porcine) Inj 1000 Unit/ML: INTRAMUSCULAR | Qty: 90 | Status: AC

## 2016-01-23 MED FILL — Sodium Chloride IV Soln 0.9%: INTRAVENOUS | Qty: 3000 | Status: AC

## 2019-10-24 ENCOUNTER — Inpatient Hospital Stay (HOSPITAL_COMMUNITY)
Admission: EM | Admit: 2019-10-24 | Discharge: 2019-10-30 | DRG: 854 | Disposition: A | Discharge status: Home / Self Care | Payer: Federal, State, Local not specified - PPO | Attending: General Surgery | Admitting: General Surgery

## 2019-10-24 ENCOUNTER — Emergency Department (HOSPITAL_COMMUNITY): Payer: Federal, State, Local not specified - PPO

## 2019-10-24 ENCOUNTER — Encounter (HOSPITAL_COMMUNITY): Payer: Self-pay | Admitting: Emergency Medicine

## 2019-10-24 ENCOUNTER — Other Ambulatory Visit: Payer: Self-pay

## 2019-10-24 DIAGNOSIS — K352 Acute appendicitis with generalized peritonitis, without abscess: Secondary | ICD-10-CM | POA: Diagnosis present

## 2019-10-24 DIAGNOSIS — D62 Acute posthemorrhagic anemia: Secondary | ICD-10-CM | POA: Diagnosis not present

## 2019-10-24 DIAGNOSIS — Z833 Family history of diabetes mellitus: Secondary | ICD-10-CM

## 2019-10-24 DIAGNOSIS — Z5331 Laparoscopic surgical procedure converted to open procedure: Secondary | ICD-10-CM | POA: Diagnosis not present

## 2019-10-24 DIAGNOSIS — Z8249 Family history of ischemic heart disease and other diseases of the circulatory system: Secondary | ICD-10-CM | POA: Diagnosis not present

## 2019-10-24 DIAGNOSIS — A419 Sepsis, unspecified organism: Principal | ICD-10-CM | POA: Diagnosis present

## 2019-10-24 DIAGNOSIS — R1031 Right lower quadrant pain: Secondary | ICD-10-CM | POA: Diagnosis present

## 2019-10-24 DIAGNOSIS — I10 Essential (primary) hypertension: Secondary | ICD-10-CM | POA: Diagnosis present

## 2019-10-24 DIAGNOSIS — Z20828 Contact with and (suspected) exposure to other viral communicable diseases: Secondary | ICD-10-CM | POA: Diagnosis present

## 2019-10-24 DIAGNOSIS — N179 Acute kidney failure, unspecified: Secondary | ICD-10-CM | POA: Diagnosis present

## 2019-10-24 DIAGNOSIS — K3532 Acute appendicitis with perforation and localized peritonitis, without abscess: Secondary | ICD-10-CM | POA: Diagnosis present

## 2019-10-24 LAB — COMPREHENSIVE METABOLIC PANEL
ALT: 13 U/L (ref 0–44)
AST: 19 U/L (ref 15–41)
Albumin: 3.7 g/dL (ref 3.5–5.0)
Alkaline Phosphatase: 52 U/L (ref 38–126)
Anion gap: 11 (ref 5–15)
BUN: 12 mg/dL (ref 6–20)
CO2: 25 mmol/L (ref 22–32)
Calcium: 9.2 mg/dL (ref 8.9–10.3)
Chloride: 100 mmol/L (ref 98–111)
Creatinine, Ser: 1.51 mg/dL — ABNORMAL HIGH (ref 0.44–1.00)
GFR calc Af Amer: 50 mL/min — ABNORMAL LOW (ref >60)
GFR calc non Af Amer: 43 mL/min — ABNORMAL LOW (ref >60)
Glucose, Bld: 140 mg/dL — ABNORMAL HIGH (ref 70–99)
Potassium: 3.6 mmol/L (ref 3.5–5.1)
Sodium: 136 mmol/L (ref 135–145)
Total Bilirubin: 1.7 mg/dL — ABNORMAL HIGH (ref 0.3–1.2)
Total Protein: 8.4 g/dL — ABNORMAL HIGH (ref 6.5–8.1)

## 2019-10-24 LAB — I-STAT BETA HCG BLOOD, ED (MC, WL, AP ONLY): I-stat hCG, quantitative: <5 m[IU]/mL (ref <5)

## 2019-10-24 LAB — URINALYSIS, ROUTINE W REFLEX MICROSCOPIC
Bacteria, UA: NONE SEEN
Bilirubin Urine: NEGATIVE
Glucose, UA: NEGATIVE mg/dL
Hgb urine dipstick: NEGATIVE
Ketones, ur: 5 mg/dL — AB
Leukocytes,Ua: NEGATIVE
Nitrite: NEGATIVE
Protein, ur: 30 mg/dL — AB
Specific Gravity, Urine: >1.046 — ABNORMAL HIGH (ref 1.005–1.030)
pH: 5 (ref 5.0–8.0)

## 2019-10-24 LAB — CBC
HCT: 40.8 % (ref 36.0–46.0)
Hemoglobin: 13.1 g/dL (ref 12.0–15.0)
MCH: 27.8 pg (ref 26.0–34.0)
MCHC: 32.1 g/dL (ref 30.0–36.0)
MCV: 86.6 fL (ref 80.0–100.0)
Platelets: 214 10*3/uL (ref 150–400)
RBC: 4.71 MIL/uL (ref 3.87–5.11)
RDW: 14.6 % (ref 11.5–15.5)
WBC: 22 10*3/uL — ABNORMAL HIGH (ref 4.0–10.5)
nRBC: 0 % (ref 0.0–0.2)

## 2019-10-24 LAB — LIPASE, BLOOD: Lipase: 21 U/L (ref 11–51)

## 2019-10-24 MED ORDER — OXYCODONE HCL 5 MG PO TABS
5.0000 mg | ORAL_TABLET | ORAL | Status: DC | PRN
  Filled 2019-10-24: qty 1

## 2019-10-24 MED ORDER — PANTOPRAZOLE SODIUM 40 MG IV SOLR
40.0000 mg | Freq: Every day | INTRAVENOUS | Status: DC
  Administered 2019-10-25 – 2019-10-28 (×5): 40 mg via INTRAVENOUS
  Filled 2019-10-24 (×5): qty 40

## 2019-10-24 MED ORDER — ONDANSETRON HCL 4 MG/2ML IJ SOLN: 4.0000 mg | Freq: Four times a day (QID) | INTRAMUSCULAR | Status: DC | PRN

## 2019-10-24 MED ORDER — ONDANSETRON HCL 4 MG/2ML IJ SOLN
4.0000 mg | Freq: Once | INTRAMUSCULAR | Status: AC
  Administered 2019-10-24: 4 mg via INTRAVENOUS
  Filled 2019-10-24: qty 2

## 2019-10-24 MED ORDER — PIPERACILLIN-TAZOBACTAM 3.375 G IVPB
3.3750 g | Freq: Three times a day (TID) | INTRAVENOUS | Status: DC
  Administered 2019-10-25 – 2019-10-29 (×13): 3.375 g via INTRAVENOUS
  Filled 2019-10-24 (×13): qty 50

## 2019-10-24 MED ORDER — ACETAMINOPHEN 650 MG RE SUPP: 650.0000 mg | Freq: Four times a day (QID) | RECTAL | Status: DC | PRN

## 2019-10-24 MED ORDER — METHOCARBAMOL 500 MG PO TABS: 500.0000 mg | ORAL_TABLET | Freq: Four times a day (QID) | ORAL | Status: DC | PRN

## 2019-10-24 MED ORDER — DIPHENHYDRAMINE HCL 25 MG PO CAPS: 25.0000 mg | ORAL_CAPSULE | Freq: Four times a day (QID) | ORAL | Status: DC | PRN

## 2019-10-24 MED ORDER — ONDANSETRON 4 MG PO TBDP: 4.0000 mg | ORAL_TABLET | Freq: Four times a day (QID) | ORAL | Status: DC | PRN

## 2019-10-24 MED ORDER — METOPROLOL TARTRATE 5 MG/5ML IV SOLN: 5.0000 mg | Freq: Four times a day (QID) | INTRAVENOUS | Status: DC | PRN

## 2019-10-24 MED ORDER — IOHEXOL 300 MG/ML  SOLN
100.0000 mL | Freq: Once | INTRAMUSCULAR | Status: AC | PRN
  Administered 2019-10-24: 100 mL via INTRAVENOUS

## 2019-10-24 MED ORDER — ACETAMINOPHEN 325 MG PO TABS: 650.0000 mg | ORAL_TABLET | Freq: Four times a day (QID) | ORAL | Status: DC | PRN

## 2019-10-24 MED ORDER — PIPERACILLIN-TAZOBACTAM 3.375 G IVPB 30 MIN
3.3750 g | Freq: Once | INTRAVENOUS | Status: AC
  Administered 2019-10-24: 3.375 g via INTRAVENOUS
  Filled 2019-10-24: qty 50

## 2019-10-24 MED ORDER — DIPHENHYDRAMINE HCL 50 MG/ML IJ SOLN: 25.0000 mg | Freq: Four times a day (QID) | INTRAMUSCULAR | Status: DC | PRN

## 2019-10-24 MED ORDER — HYDROMORPHONE HCL 1 MG/ML IJ SOLN
1.0000 mg | Freq: Once | INTRAMUSCULAR | Status: AC
  Administered 2019-10-24: 1 mg via INTRAVENOUS
  Filled 2019-10-24: qty 1

## 2019-10-24 MED ORDER — ZOLPIDEM TARTRATE 5 MG PO TABS: 5.0000 mg | ORAL_TABLET | Freq: Every evening | ORAL | Status: DC | PRN

## 2019-10-24 MED ORDER — LACTATED RINGERS IV BOLUS
1000.0000 mL | Freq: Once | INTRAVENOUS | Status: AC
  Administered 2019-10-24: 1000 mL via INTRAVENOUS

## 2019-10-24 MED ORDER — SODIUM CHLORIDE 0.9% FLUSH
3.0000 mL | Freq: Once | INTRAVENOUS | Status: AC
  Administered 2019-10-24: 3 mL via INTRAVENOUS

## 2019-10-24 MED ORDER — SODIUM CHLORIDE 0.9 % IV BOLUS
500.0000 mL | Freq: Once | INTRAVENOUS | Status: AC
  Administered 2019-10-25: 500 mL via INTRAVENOUS

## 2019-10-24 MED ORDER — POTASSIUM CHLORIDE IN NACL 20-0.9 MEQ/L-% IV SOLN
INTRAVENOUS | Status: DC
  Administered 2019-10-25 – 2019-10-28 (×5): via INTRAVENOUS
  Filled 2019-10-24 (×5): qty 1000

## 2019-10-24 MED ORDER — MORPHINE SULFATE (PF) 4 MG/ML IV SOLN
4.0000 mg | INTRAVENOUS | Status: DC | PRN
  Administered 2019-10-25 – 2019-10-26 (×7): 4 mg via INTRAVENOUS
  Filled 2019-10-24 (×7): qty 1

## 2019-10-24 MED ORDER — FENTANYL CITRATE (PF) 100 MCG/2ML IJ SOLN
50.0000 ug | Freq: Once | INTRAMUSCULAR | Status: AC
  Administered 2019-10-24: 50 ug via INTRAVENOUS
  Filled 2019-10-24: qty 2

## 2019-10-24 NOTE — ED Triage Notes (Signed)
Pt c/o RLQ pain that radiates to the left that stared on Friday. Reports nausea since Friday, and vomiting that started yesterday. Hx ovarian cyst on the left, denies abnormal vaginal bleeding/discahrge.

## 2019-10-24 NOTE — H&P (Signed)
Melanie [name] is an 38 y.o. female.   Chief Complaint: RLQ abdominal pain HPI: 38 year old female with history of hypertension presented to the emergency department earlier today complaining of right lower quadrant pain associated with nausea since last Friday.  The pain is progressively gotten worse.  She has decreased appetite and nausea but no frank vomiting.  She underwent evaluation in the emergency department including laboratory studies revealing leukocytosis and mild acute kidney injury.  CT scan of the abdomen and pelvis was done demonstrating perforated appendicitis without defined abscess.  I was asked to see her for surgical management.  She denies recent upper respiratory infections.  Denies sick contacts.  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  Past Medical History:  Diagnosis Date  . Anemia    hx  . Chicken pox chicken pox  . Fracture of arm    right  . Heart murmur    no problems or tests  . Ovarian cyst     Past Surgical History:  Procedure Laterality Date  . CESAREAN SECTION N/A 06/16/2013   Procedure: CESAREAN SECTION with left side tubal ligation;  Surgeon: [name] Badharles A Harper, MD;  Location: [facility];  Service: Obstetrics;  Laterality: N/A;  left side tubal ligation, patient has no right side fallopian tube  . ovarian cystectomy Right    38 yrs old    Family History  Problem Relation Age of Onset  . Hypertension Father   . Diabetes Paternal Grandmother   . Cancer Neg Hx   . Stroke Neg Hx    Social History:  reports that she has never smoked. She has never used smokeless tobacco. She reports that she does not drink alcohol or use drugs.  Allergies: No Known Allergies  (Not in a hospital admission)   Results for orders placed or performed during the hospital encounter of 10/24/19 (from the past 48 hour(s))  Lipase, blood     Status: None   Collection Time: 10/24/19  4:26 PM  Result Value Ref Range   Lipase 21 11 - 51 U/L    Comment:  Performed at [facility], [address] [place], [place] [address]  Comprehensive metabolic panel     Status: Abnormal   Collection Time: 10/24/19  4:26 PM  Result Value Ref Range   Sodium 136 135 - 145 mmol/L   Potassium 3.6 3.5 - 5.1 mmol/L   Chloride 100 98 - 111 mmol/L   CO2 25 22 - 32 mmol/L   Glucose, Bld 140 (H) 70 - 99 mg/dL   BUN 12 6 - 20 mg/dL   Creatinine, Ser 6.041.51 (H) 0.44 - 1.00 mg/dL   Calcium 9.2 8.9 - 54.010.3 mg/dL   Total Protein 8.4 (H) 6.5 - 8.1 g/dL   Albumin 3.7 3.5 - 5.0 g/dL   AST 19 15 - 41 U/L   ALT 13 0 - 44 U/L   Alkaline Phosphatase 52 38 - 126 U/L   Total Bilirubin 1.7 (H) 0.3 - 1.2 mg/dL   GFR calc non Af Amer 43 (L) >60 mL/min   GFR calc Af Amer 50 (L) >60 mL/min   Anion gap 11 5 - 15    Comment: Performed at [facility], [address] [place], [place] [address]  CBC     Status: Abnormal   Collection Time: 10/24/19  4:26 PM  Result Value Ref Range   WBC 22.0 (H) 4.0 - 10.5 K/uL   RBC 4.71 3.87 -  5.11 MIL/uL   Hemoglobin 13.1 12.0 - 15.0 g/dL   HCT 40.8 36.0 - 46.0 %   MCV 86.6 80.0 - 100.0 fL   MCH 27.8 26.0 - 34.0 pg   MCHC 32.1 30.0 - 36.0 g/dL   RDW 14.6 11.5 - 15.5 %   Platelets 214 150 - 400 K/uL   nRBC 0.0 0.0 - 0.2 %    Comment: Performed at [facility], [address], [place], [place] [address]  I-Stat beta hCG blood, ED     Status: None   Collection Time: 10/24/19  5:08 PM  Result Value Ref Range   I-stat hCG, quantitative <5.0 <5 mIU/mL   Comment 3            Comment:   GEST. AGE      CONC.  (mIU/mL)   <=1 WEEK        5 - 50     2 WEEKS       50 - 500     3 WEEKS       100 - 10,000     4 WEEKS     1,000 - 30,000        FEMALE AND NON-PREGNANT FEMALE:     LESS THAN 5 mIU/mL   Urinalysis, Routine w reflex microscopic     Status: Abnormal   Collection Time: 10/24/19  9:25 PM  Result Value Ref Range   Color, Urine AMBER (A) YELLOW    Comment: BIOCHEMICALS MAY BE AFFECTED BY COLOR   APPearance HAZY (A)  CLEAR   Specific Gravity, Urine >1.046 (H) 1.005 - 1.030   pH 5.0 5.0 - 8.0   Glucose, UA NEGATIVE NEGATIVE mg/dL   Hgb urine dipstick NEGATIVE NEGATIVE   Bilirubin Urine NEGATIVE NEGATIVE   Ketones, ur 5 (A) NEGATIVE mg/dL   Protein, ur 30 (A) NEGATIVE mg/dL   Nitrite NEGATIVE NEGATIVE   Leukocytes,Ua NEGATIVE NEGATIVE   RBC / HPF 6-10 0 - 5 RBC/hpf   WBC, UA 11-20 0 - 5 WBC/hpf   Bacteria, UA NONE SEEN NONE SEEN   Squamous Epithelial / LPF 6-10 0 - 5   Mucus PRESENT    Hyaline Casts, UA PRESENT     Comment: Performed at [facility], [address], [place], [place] [address]   Ct Abdomen Pelvis W Contrast  Result Date: 10/24/2019 CLINICAL DATA:  Right lower quadrant pain EXAM: CT ABDOMEN AND PELVIS WITH CONTRAST TECHNIQUE: Multidetector CT imaging of the abdomen and pelvis was performed using the standard protocol following bolus administration of intravenous contrast. CONTRAST:  185mL OMNIPAQUE IOHEXOL 300 MG/ML  SOLN COMPARISON:  None. FINDINGS: Lower chest: Lung bases demonstrate patchy atelectasis at the right base. No consolidation or effusion. The heart size is normal. Hepatobiliary: Subcentimeter hypodensity in the posterior right hepatic lobe too small to further characterize. Possible sludge in the gallbladder. No calcified gallstone or biliary dilatation Pancreas: Unremarkable. No pancreatic ductal dilatation or surrounding inflammatory changes. Spleen: Normal in size without focal abnormality. Adrenals/Urinary Tract: Adrenal glands are unremarkable. Kidneys are normal, without renal calculi, focal lesion, or hydronephrosis. Bladder is unremarkable. Stomach/Bowel: The stomach is nonenlarged. No dilated small bowel. Fluid filled right colon. Abnormal appendix in the right lower quadrant measuring up to 17 mm with mucosal enhancement. Considerable right lower quadrant inflammatory changes. Small gas and fluid collection adjacent to the appendix. Slightly thickened but  nondistended small bowel in the pelvis with mucosal enhancement. Vascular/Lymphatic: Nonaneurysmal aorta. Small right  lower quadrant lymph nodes Reproductive: Uterus and bilateral adnexa are unremarkable. Other: Slightly complex fluid in the posterior pelvis, without well-defined rim. Additional foci of fluid within the pelvis and interspersed among small bowel. Mild soft tissue stranding within the mesentery. Musculoskeletal: Posterior rods and fixating screws L4 through S1. IMPRESSION: 1. Findings are suspicious for an acute perforated appendicitis. No organized abscess at this time, however multiple small fluid collections are noted within the lower abdomen and pelvis, posterior pelvic collection is slightly complex. Mild generalized soft tissue stranding in the mesentery consistent with generalized inflammation/peritonitis. Appendix: Location: Retrocecal, right lower quadrant Diameter: 17 mm Appendicolith: Not seen Mucosal hyper-enhancement: Present Extraluminal gas: Present Periappendiceal collection: Small amount of fluid and gas adjacent to the appendix without well-formed abscess. Additional small fluid collections within the lower abdomen and pelvis 2. Possible increased intraluminal density within the gallbladder as may be seen with sludge Critical Value/emergent results were called by telephone at the time of interpretation on 10/24/2019 at 9:33 pm to [facility] , who verbally acknowledged these results. Electronically Signed   By: Jasmine Pang M.D.   On: 10/24/2019 21:33    Review of Systems  Constitutional: Positive for malaise/fatigue. Negative for chills and fever.  HENT: Negative.   Eyes: Negative.   Respiratory: Negative for cough and shortness of breath.   Cardiovascular: Negative for chest pain.  Gastrointestinal: Positive for abdominal pain and nausea. Negative for constipation and vomiting.  Genitourinary: Negative.   Musculoskeletal: Negative.   Skin: Negative.    Neurological: Negative.   Endo/Heme/Allergies: Negative.   Psychiatric/Behavioral: Negative.     Blood pressure 122/63, pulse (!) 115, temperature 98.7 F (37.1 C), temperature source Oral, resp. rate 20, height 5\' 10"  (1.778 m), weight 108.9 kg, last menstrual period 10/17/2019, SpO2 100 %. Physical Exam  Constitutional: She is oriented to person, place, and time. She appears well-developed and well-nourished. No distress.  HENT:  Head: Normocephalic.  Right Ear: External ear normal.  Left Ear: External ear normal.  Mouth/Throat: Oropharynx is clear and moist.  Eyes: Pupils are equal, round, and reactive to light. EOM are normal. No scleral icterus.  Neck: Neck supple. No thyromegaly present.  Cardiovascular: Regular rhythm, normal heart sounds and intact distal pulses.  Tachycardic 115  Respiratory: Effort normal and breath sounds normal. No respiratory distress. She has no wheezes. She has no rales.  GI: Soft. She exhibits no distension. There is abdominal tenderness. There is no rebound.  Tender right lower quadrant with some voluntary guarding but no generalized tenderness, no peritonitis  Musculoskeletal: Normal range of motion.        General: No edema.  Neurological: She is alert and oriented to person, place, and time.  Skin: Skin is warm.  Psychiatric: She has a normal mood and affect.     Assessment/Plan Acute perforated appendicitis -admit, n.p.o., IV Zosyn, reexamine in a.m. once Covid test is complete and determine plan: continued IV antibiotics versus laparoscopic appendectomy.  I will discuss with Dr. 10/19/2019.  Acute kidney injury -normal saline bolus and IV fluids  History of hypertension -plan resume HCTZ tomorrow pending labs  Fredricka Bonine, MD 10/24/2019, 10:40 PM

## 2019-10-24 NOTE — ED Provider Notes (Signed)
Emergency Department Provider Note   I have reviewed the triage vital signs and the nursing notes.   HISTORY  Chief Complaint Abdominal Pain   HPI Melanie [name] is a 38 y.o. female who presents to the emergency department today secondary to abdominal pain.  Patient dates that she has some right lower quadrant all pain for a few days but a progressively got worse and he has significantly worse today.  She had some nausea but no vomiting.  No changes in her stools. no fevers.   No other associated or modifying symptoms.    Past Medical History:  Diagnosis Date  . Anemia    hx  . Chicken pox chicken pox  . Fracture of arm    right  . Heart murmur    no problems or tests  . Ovarian cyst     Patient Active Problem List   Diagnosis Date Noted  . Perforated appendicitis 10/24/2019  . Lumbar stenosis with neurogenic claudication 01/21/2016  . Obesity (BMI 35.0-39.9 without comorbidity) 08/18/2013    Past Surgical History:  Procedure Laterality Date  . CESAREAN SECTION N/A 06/16/2013   Procedure: CESAREAN SECTION with left side tubal ligation;  Surgeon: [name] Bad, MD;  Location: [facility];  Service: Obstetrics;  Laterality: N/A;  left side tubal ligation, patient has no right side fallopian tube  . ovarian cystectomy Right    38 yrs old    Current Outpatient Rx  . Order #: [id number] Class: Historical Med  . Order #: [id number] Class: Historical Med    Allergies Patient has no known allergies.  Family History  Problem Relation Age of Onset  . Hypertension Father   . Diabetes Paternal Grandmother   . Cancer Neg Hx   . Stroke Neg Hx     Social History Social History   Tobacco Use  . Smoking status: Never Smoker  . Smokeless tobacco: Never Used  Substance Use Topics  . Alcohol use: No  . Drug use: No    Review of Systems  All other systems negative except as documented in the HPI. All pertinent positives and negatives as reviewed in the HPI.  ____________________________________________   PHYSICAL EXAM:  VITAL SIGNS: ED Triage Vitals  Enc Vitals Group     BP 10/24/19 1621 120/69     Pulse Rate 10/24/19 1621 (!) 115     Resp 10/24/19 1621 20     Temp 10/24/19 1621 98.7 F (37.1 C)     Temp Source 10/24/19 1621 Oral     SpO2 10/24/19 1621 100 %     Weight 10/24/19 1620 240 lb (108.9 kg)     Height 10/24/19 1620 5\' 10"  (1.778 m)    Constitutional: Alert and oriented. Well appearing and in no acute distress. Eyes: Conjunctivae are normal. PERRL. EOMI. Head: Atraumatic. Nose: No congestion/rhinnorhea. Mouth/Throat: Mucous membranes are moist.  Oropharynx non-erythematous. Neck: No stridor.  No meningeal signs.   Cardiovascular: tachycardic rate, regular rhythm. Good peripheral circulation. Grossly normal heart sounds.   Respiratory: Normal respiratory effort.  No retractions. Lungs CTAB. Gastrointestinal: Soft and diffusely tender with voluntary guarding. No distention.  Musculoskeletal: No lower extremity tenderness nor edema. No gross deformities of extremities. Neurologic:  Normal speech and language. No gross focal neurologic deficits are appreciated.  Skin:  Skin is warm, dry and intact. No rash noted.  ____________________________________________   LABS (all labs ordered are listed, but only abnormal results are displayed)  Labs Reviewed  COMPREHENSIVE METABOLIC PANEL - Abnormal;  Notable for the following components:      Result Value   Glucose, Bld 140 (*)    Creatinine, Ser 1.51 (*)    Total Protein 8.4 (*)    Total Bilirubin 1.7 (*)    GFR calc non Af Amer 43 (*)    GFR calc Af Amer 50 (*)    All other components within normal limits  CBC - Abnormal; Notable for the following components:   WBC 22.0 (*)    All other components within normal limits  URINALYSIS, ROUTINE W REFLEX MICROSCOPIC - Abnormal; Notable for the following components:   Color, Urine AMBER (*)    APPearance HAZY (*)    Specific  Gravity, Urine >1.046 (*)    Ketones, ur 5 (*)    Protein, ur 30 (*)    All other components within normal limits  SARS CORONAVIRUS 2 (TAT 6-24 HRS)  LIPASE, BLOOD  I-STAT BETA HCG BLOOD, ED ([facility], [facility], [facility] ONLY)   ____________________________________________  RADIOLOGY  Ct Abdomen Pelvis W Contrast  Result Date: 10/24/2019 CLINICAL DATA:  Right lower quadrant pain EXAM: CT ABDOMEN AND PELVIS WITH CONTRAST TECHNIQUE: Multidetector CT imaging of the abdomen and pelvis was performed using the standard protocol following bolus administration of intravenous contrast. CONTRAST:  120mL OMNIPAQUE IOHEXOL 300 MG/ML  SOLN COMPARISON:  None. FINDINGS: Lower chest: Lung bases demonstrate patchy atelectasis at the right base. No consolidation or effusion. The heart size is normal. Hepatobiliary: Subcentimeter hypodensity in the posterior right hepatic lobe too small to further characterize. Possible sludge in the gallbladder. No calcified gallstone or biliary dilatation Pancreas: Unremarkable. No pancreatic ductal dilatation or surrounding inflammatory changes. Spleen: Normal in size without focal abnormality. Adrenals/Urinary Tract: Adrenal glands are unremarkable. Kidneys are normal, without renal calculi, focal lesion, or hydronephrosis. Bladder is unremarkable. Stomach/Bowel: The stomach is nonenlarged. No dilated small bowel. Fluid filled right colon. Abnormal appendix in the right lower quadrant measuring up to 17 mm with mucosal enhancement. Considerable right lower quadrant inflammatory changes. Small gas and fluid collection adjacent to the appendix. Slightly thickened but nondistended small bowel in the pelvis with mucosal enhancement. Vascular/Lymphatic: Nonaneurysmal aorta. Small right lower quadrant lymph nodes Reproductive: Uterus and bilateral adnexa are unremarkable. Other: Slightly complex fluid in the posterior pelvis, without well-defined rim. Additional foci of fluid within the pelvis and  interspersed among small bowel. Mild soft tissue stranding within the mesentery. Musculoskeletal: Posterior rods and fixating screws L4 through S1. IMPRESSION: 1. Findings are suspicious for an acute perforated appendicitis. No organized abscess at this time, however multiple small fluid collections are noted within the lower abdomen and pelvis, posterior pelvic collection is slightly complex. Mild generalized soft tissue stranding in the mesentery consistent with generalized inflammation/peritonitis. Appendix: Location: Retrocecal, right lower quadrant Diameter: 17 mm Appendicolith: Not seen Mucosal hyper-enhancement: Present Extraluminal gas: Present Periappendiceal collection: Small amount of fluid and gas adjacent to the appendix without well-formed abscess. Additional small fluid collections within the lower abdomen and pelvis 2. Possible increased intraluminal density within the gallbladder as may be seen with sludge Critical Value/emergent results were called by telephone at the time of interpretation on 10/24/2019 at 9:33 pm to [facility] , who verbally acknowledged these results. Electronically Signed   By: Donavan Foil M.D.   On: 10/24/2019 21:33    ____________________________________________   PROCEDURES  Procedure(s) performed:   .Critical Care Performed by: Merrily Pew, MD Authorized by: Merrily Pew, MD   Critical care provider statement:    Critical  care time (minutes):  45   Critical care was necessary to treat or prevent imminent or life-threatening deterioration of the following conditions:  Sepsis   Critical care was time spent personally by me on the following activities:  Discussions with consultants, evaluation of patient's response to treatment, examination of patient, ordering and performing treatments and interventions, ordering and review of laboratory studies, ordering and review of radiographic studies, pulse oximetry, re-evaluation of patient's condition,  obtaining history from patient or surrogate and review of old charts   ____________________________________________   INITIAL IMPRESSION / ASSESSMENT AND PLAN / ED COURSE  Perforated appendicitis. Fluids given. Antibiotics given. Pain controlled. D/w surgery, will see for admission.   Pertinent labs & imaging results that were available during my care of the patient were reviewed by me and considered in my medical decision making (see chart for details).  A medical screening exam was performed and I feel the patient has had an appropriate workup for their chief complaint at this time and likelihood of emergent condition existing is low. They have been counseled on decision, discharge, follow up and which symptoms necessitate immediate return to the emergency department. They or their family verbally stated understanding and agreement with plan and discharged in stable condition.   ____________________________________________  FINAL CLINICAL IMPRESSION(S) / ED DIAGNOSES  Final diagnoses:  Sepsis, due to unspecified organism, unspecified whether acute organ dysfunction present [facility](HCC)     MEDICATIONS GIVEN DURING THIS VISIT:  Medications  sodium chloride flush (NS) 0.9 % injection 3 mL (3 mLs Intravenous Given 10/24/19 1949)  fentaNYL (SUBLIMAZE) injection 50 mcg (50 mcg Intravenous Given 10/24/19 1955)  lactated ringers bolus 1,000 mL (0 mLs Intravenous Stopped 10/24/19 2130)  iohexol (OMNIPAQUE) 300 MG/ML solution 100 mL (100 mLs Intravenous Contrast Given 10/24/19 2102)  piperacillin-tazobactam (ZOSYN) IVPB 3.375 g (0 g Intravenous Stopped 10/24/19 2255)  lactated ringers bolus 1,000 mL (1,000 mLs Intravenous New Bag/Given 10/24/19 2137)  HYDROmorphone (DILAUDID) injection 1 mg (1 mg Intravenous Given 10/24/19 2144)  ondansetron (ZOFRAN) injection 4 mg (4 mg Intravenous Given 10/24/19 2143)     NEW OUTPATIENT MEDICATIONS STARTED DURING THIS VISIT:  New Prescriptions   No  medications on file    Note:  This note was prepared with assistance of Dragon voice recognition software. Occasional wrong-word or sound-a-like substitutions may have occurred due to the inherent limitations of voice recognition software.   Marily MemosMesner, Janki Dike, MD 10/24/19 [phone]

## 2019-10-25 ENCOUNTER — Encounter (HOSPITAL_COMMUNITY): Payer: Self-pay | Admitting: Certified Registered Nurse Anesthetist

## 2019-10-25 ENCOUNTER — Inpatient Hospital Stay (HOSPITAL_COMMUNITY): Payer: Federal, State, Local not specified - PPO | Admitting: Certified Registered Nurse Anesthetist

## 2019-10-25 ENCOUNTER — Encounter (HOSPITAL_COMMUNITY): Admission: EM | Disposition: A | Discharge status: Home / Self Care | Payer: Self-pay | Source: Home / Self Care

## 2019-10-25 HISTORY — PX: APPENDECTOMY: SHX54

## 2019-10-25 HISTORY — PX: LAPAROSCOPIC APPENDECTOMY: SHX408

## 2019-10-25 LAB — SURGICAL PCR SCREEN
MRSA, PCR: NEGATIVE
Staphylococcus aureus: NEGATIVE

## 2019-10-25 LAB — BASIC METABOLIC PANEL
Anion gap: 10 (ref 5–15)
BUN: 14 mg/dL (ref 6–20)
CO2: 22 mmol/L (ref 22–32)
Calcium: 8.3 mg/dL — ABNORMAL LOW (ref 8.9–10.3)
Chloride: 104 mmol/L (ref 98–111)
Creatinine, Ser: 1.26 mg/dL — ABNORMAL HIGH (ref 0.44–1.00)
GFR calc Af Amer: >60 mL/min (ref >60)
GFR calc non Af Amer: 54 mL/min — ABNORMAL LOW (ref >60)
Glucose, Bld: 103 mg/dL — ABNORMAL HIGH (ref 70–99)
Potassium: 3.8 mmol/L (ref 3.5–5.1)
Sodium: 136 mmol/L (ref 135–145)

## 2019-10-25 LAB — CBC
HCT: 35.4 % — ABNORMAL LOW (ref 36.0–46.0)
Hemoglobin: 11.4 g/dL — ABNORMAL LOW (ref 12.0–15.0)
MCH: 27.6 pg (ref 26.0–34.0)
MCHC: 32.2 g/dL (ref 30.0–36.0)
MCV: 85.7 fL (ref 80.0–100.0)
Platelets: 181 10*3/uL (ref 150–400)
RBC: 4.13 MIL/uL (ref 3.87–5.11)
RDW: 14.9 % (ref 11.5–15.5)
WBC: 20.1 10*3/uL — ABNORMAL HIGH (ref 4.0–10.5)
nRBC: 0 % (ref 0.0–0.2)

## 2019-10-25 LAB — HIV ANTIBODY (ROUTINE TESTING W REFLEX): HIV Screen 4th Generation wRfx: NONREACTIVE

## 2019-10-25 LAB — SARS CORONAVIRUS 2 (TAT 6-24 HRS): SARS Coronavirus 2: NEGATIVE

## 2019-10-25 SURGERY — APPENDECTOMY, LAPAROSCOPIC
Anesthesia: General | Site: Abdomen

## 2019-10-25 MED ORDER — FENTANYL CITRATE (PF) 100 MCG/2ML IJ SOLN
25.0000 ug | INTRAMUSCULAR | Status: DC | PRN
  Administered 2019-10-25: 50 ug via INTRAVENOUS

## 2019-10-25 MED ORDER — PHENYLEPHRINE HCL-NACL 10-0.9 MG/250ML-% IV SOLN
INTRAVENOUS | Status: DC | PRN
  Administered 2019-10-25: 20 ug/min via INTRAVENOUS

## 2019-10-25 MED ORDER — SUCCINYLCHOLINE CHLORIDE 200 MG/10ML IV SOSY
PREFILLED_SYRINGE | INTRAVENOUS | Status: AC
  Filled 2019-10-25: qty 10

## 2019-10-25 MED ORDER — ROCURONIUM BROMIDE 10 MG/ML (PF) SYRINGE
PREFILLED_SYRINGE | INTRAVENOUS | Status: AC
  Filled 2019-10-25: qty 10

## 2019-10-25 MED ORDER — ALBUMIN HUMAN 5 % IV SOLN
INTRAVENOUS | Status: DC | PRN
  Administered 2019-10-25: 10:00:00 via INTRAVENOUS

## 2019-10-25 MED ORDER — ACETAMINOPHEN 160 MG/5ML PO SOLN: 325.0000 mg | ORAL | Status: DC | PRN

## 2019-10-25 MED ORDER — FENTANYL CITRATE (PF) 250 MCG/5ML IJ SOLN
INTRAMUSCULAR | Status: AC
  Filled 2019-10-25: qty 5

## 2019-10-25 MED ORDER — ONDANSETRON HCL 4 MG/2ML IJ SOLN
INTRAMUSCULAR | Status: DC | PRN
  Administered 2019-10-25: 4 mg via INTRAVENOUS

## 2019-10-25 MED ORDER — ROCURONIUM BROMIDE 10 MG/ML (PF) SYRINGE
PREFILLED_SYRINGE | INTRAVENOUS | Status: DC | PRN
  Administered 2019-10-25 (×2): 10 mg via INTRAVENOUS
  Administered 2019-10-25: 40 mg via INTRAVENOUS

## 2019-10-25 MED ORDER — MIDAZOLAM HCL 2 MG/2ML IJ SOLN
INTRAMUSCULAR | Status: AC
  Filled 2019-10-25: qty 2

## 2019-10-25 MED ORDER — OXYCODONE HCL 5 MG PO TABS: 5.0000 mg | ORAL_TABLET | Freq: Once | ORAL | Status: DC | PRN

## 2019-10-25 MED ORDER — BUPIVACAINE-EPINEPHRINE 0.5% -1:200000 IJ SOLN
INTRAMUSCULAR | Status: AC
  Filled 2019-10-25: qty 1

## 2019-10-25 MED ORDER — PHENYLEPHRINE 40 MCG/ML (10ML) SYRINGE FOR IV PUSH (FOR BLOOD PRESSURE SUPPORT)
PREFILLED_SYRINGE | INTRAVENOUS | Status: AC
  Filled 2019-10-25: qty 10

## 2019-10-25 MED ORDER — 0.9 % SODIUM CHLORIDE (POUR BTL) OPTIME
TOPICAL | Status: DC | PRN
  Administered 2019-10-25: 3000 mL
  Administered 2019-10-25: 1000 mL

## 2019-10-25 MED ORDER — PROPOFOL 10 MG/ML IV BOLUS
INTRAVENOUS | Status: DC | PRN
  Administered 2019-10-25: 120 mg via INTRAVENOUS

## 2019-10-25 MED ORDER — BUPIVACAINE LIPOSOME 1.3 % IJ SUSP
INTRAMUSCULAR | Status: DC | PRN
  Administered 2019-10-25: .1 mL

## 2019-10-25 MED ORDER — ONDANSETRON HCL 4 MG/2ML IJ SOLN
INTRAMUSCULAR | Status: AC
  Filled 2019-10-25: qty 2

## 2019-10-25 MED ORDER — FENTANYL CITRATE (PF) 100 MCG/2ML IJ SOLN
INTRAMUSCULAR | Status: AC
  Filled 2019-10-25: qty 2

## 2019-10-25 MED ORDER — LACTATED RINGERS IV SOLN
INTRAVENOUS | Status: DC
  Administered 2019-10-25 (×2): via INTRAVENOUS

## 2019-10-25 MED ORDER — SUCCINYLCHOLINE CHLORIDE 200 MG/10ML IV SOSY
PREFILLED_SYRINGE | INTRAVENOUS | Status: DC | PRN
  Administered 2019-10-25: 130 mg via INTRAVENOUS

## 2019-10-25 MED ORDER — LIDOCAINE 2% (20 MG/ML) 5 ML SYRINGE
INTRAMUSCULAR | Status: DC | PRN
  Administered 2019-10-25: 70 mg via INTRAVENOUS

## 2019-10-25 MED ORDER — BUPIVACAINE-EPINEPHRINE (PF) 0.5% -1:200000 IJ SOLN: INTRAMUSCULAR | Status: DC | PRN

## 2019-10-25 MED ORDER — SODIUM CHLORIDE 0.9 % IR SOLN
Status: DC | PRN
  Administered 2019-10-25: 1000 mL

## 2019-10-25 MED ORDER — MIDAZOLAM HCL 5 MG/5ML IJ SOLN
INTRAMUSCULAR | Status: DC | PRN
  Administered 2019-10-25: 2 mg via INTRAVENOUS

## 2019-10-25 MED ORDER — FENTANYL CITRATE (PF) 250 MCG/5ML IJ SOLN
INTRAMUSCULAR | Status: DC | PRN
  Administered 2019-10-25: 50 ug via INTRAVENOUS
  Administered 2019-10-25: 25 ug via INTRAVENOUS
  Administered 2019-10-25 (×2): 50 ug via INTRAVENOUS

## 2019-10-25 MED ORDER — MEPERIDINE HCL 25 MG/ML IJ SOLN: 6.2500 mg | INTRAMUSCULAR | Status: DC | PRN

## 2019-10-25 MED ORDER — ONDANSETRON HCL 4 MG/2ML IJ SOLN: 4.0000 mg | Freq: Once | INTRAMUSCULAR | Status: DC | PRN

## 2019-10-25 MED ORDER — STERILE WATER FOR IRRIGATION IR SOLN
Status: DC | PRN
  Administered 2019-10-25: 1000 mL

## 2019-10-25 MED ORDER — ACETAMINOPHEN 325 MG PO TABS: 325.0000 mg | ORAL_TABLET | ORAL | Status: DC | PRN

## 2019-10-25 MED ORDER — PHENYLEPHRINE 40 MCG/ML (10ML) SYRINGE FOR IV PUSH (FOR BLOOD PRESSURE SUPPORT)
PREFILLED_SYRINGE | INTRAVENOUS | Status: DC | PRN
  Administered 2019-10-25: 120 ug via INTRAVENOUS
  Administered 2019-10-25 (×2): 80 ug via INTRAVENOUS

## 2019-10-25 MED ORDER — OXYCODONE HCL 5 MG/5ML PO SOLN: 5.0000 mg | Freq: Once | ORAL | Status: DC | PRN

## 2019-10-25 MED ORDER — DEXAMETHASONE SODIUM PHOSPHATE 10 MG/ML IJ SOLN
INTRAMUSCULAR | Status: DC | PRN
  Administered 2019-10-25: 10 mg via INTRAVENOUS

## 2019-10-25 SURGICAL SUPPLY — 66 items
ADH SKN CLS APL DERMABOND .7 (GAUZE/BANDAGES/DRESSINGS) ×1
APL PRP STRL LF DISP 70% ISPRP (MISCELLANEOUS) ×1
APPLIER CLIP 5 13 M/L LIGAMAX5 (MISCELLANEOUS)
APR CLP MED LRG 5 ANG JAW (MISCELLANEOUS)
BAG SPEC RTRVL LRG 6X4 10 (ENDOMECHANICALS) ×1
BLADE CLIPPER SURG (BLADE) IMPLANT
CANISTER SUCT 3000ML PPV (MISCELLANEOUS) ×3 IMPLANT
CELLS DAT CNTRL 66122 CELL SVR (MISCELLANEOUS) ×1 IMPLANT
CHLORAPREP W/TINT 26 (MISCELLANEOUS) ×3 IMPLANT
CLIP APPLIE 5 13 M/L LIGAMAX5 (MISCELLANEOUS) IMPLANT
COVER SURGICAL LIGHT HANDLE (MISCELLANEOUS) ×3 IMPLANT
COVER WAND RF STERILE (DRAPES) ×1 IMPLANT
CUTTER FLEX LINEAR 45M (STAPLE) ×3 IMPLANT
DERMABOND ADVANCED (GAUZE/BANDAGES/DRESSINGS) ×2
DERMABOND ADVANCED .7 DNX12 (GAUZE/BANDAGES/DRESSINGS) ×1 IMPLANT
DRAIN CHANNEL 19F RND (DRAIN) ×2 IMPLANT
DRESSING PREVENA PLUS CUSTOM (GAUZE/BANDAGES/DRESSINGS) IMPLANT
DRSG PREVENA PLUS CUSTOM (GAUZE/BANDAGES/DRESSINGS) ×3
ELECT REM PT RETURN 9FT ADLT (ELECTROSURGICAL) ×3
ELECTRODE REM PT RTRN 9FT ADLT (ELECTROSURGICAL) ×1 IMPLANT
EVACUATOR SILICONE 100CC (DRAIN) ×2 IMPLANT
GLOVE BIO SURGEON STRL SZ 6 (GLOVE) ×3 IMPLANT
GLOVE INDICATOR 6.5 STRL GRN (GLOVE) ×3 IMPLANT
GOWN STRL REUS W/ TWL LRG LVL3 (GOWN DISPOSABLE) ×3 IMPLANT
GOWN STRL REUS W/TWL LRG LVL3 (GOWN DISPOSABLE) ×9
GRASPER SUT TROCAR 14GX15 (MISCELLANEOUS) ×3 IMPLANT
KIT BASIN OR (CUSTOM PROCEDURE TRAY) ×3 IMPLANT
KIT TURNOVER KIT B (KITS) ×3 IMPLANT
LIGASURE IMPACT 36 18CM CVD LR (INSTRUMENTS) ×2 IMPLANT
NDL INSUFFLATION 14GA 120MM (NEEDLE) ×1 IMPLANT
NEEDLE INSUFFLATION 14GA 120MM (NEEDLE) ×3 IMPLANT
NS IRRIG 1000ML POUR BTL (IV SOLUTION) ×3 IMPLANT
PAD ARMBOARD 7.5X6 YLW CONV (MISCELLANEOUS) ×6 IMPLANT
PENCIL SMOKE EVACUATOR (MISCELLANEOUS) ×2 IMPLANT
POUCH SPECIMEN RETRIEVAL 10MM (ENDOMECHANICALS) ×3 IMPLANT
RELOAD 45 VASCULAR/THIN (ENDOMECHANICALS) IMPLANT
RELOAD PROXIMATE 75MM BLUE (ENDOMECHANICALS) ×6 IMPLANT
RELOAD STAPLE 45 2.5 WHT GRN (ENDOMECHANICALS) IMPLANT
RELOAD STAPLE 45 3.5 BLU ETS (ENDOMECHANICALS) IMPLANT
RELOAD STAPLE 75 3.8 BLU REG (ENDOMECHANICALS) IMPLANT
RELOAD STAPLE TA45 3.5 REG BLU (ENDOMECHANICALS) IMPLANT
RETRACTOR WND ALEXIS 18 MED (MISCELLANEOUS) IMPLANT
RTRCTR WOUND ALEXIS 18CM MED (MISCELLANEOUS) ×3
SCISSORS LAP 5X35 DISP (ENDOMECHANICALS) IMPLANT
SET IRRIG TUBING LAPAROSCOPIC (IRRIGATION / IRRIGATOR) ×3 IMPLANT
SET TUBE SMOKE EVAC HIGH FLOW (TUBING) ×3 IMPLANT
SHEARS HARMONIC ACE PLUS 36CM (ENDOMECHANICALS) ×2 IMPLANT
SLEEVE ENDOPATH XCEL 5M (ENDOMECHANICALS) ×3 IMPLANT
SPECIMEN JAR SMALL (MISCELLANEOUS) ×3 IMPLANT
SPONGE LAP 18X18 X RAY DECT (DISPOSABLE) ×2 IMPLANT
STAPLER GUN LINEAR PROX 60 (STAPLE) ×2 IMPLANT
STAPLER PROXIMATE 75MM BLUE (STAPLE) ×2 IMPLANT
SUT ETHILON 2 0 FS 18 (SUTURE) ×2 IMPLANT
SUT MNCRL AB 4-0 PS2 18 (SUTURE) ×3 IMPLANT
SUT SILK 2 0SH CR/8 30 (SUTURE) ×2 IMPLANT
SUT SILK 3 0 SH CR/8 (SUTURE) ×2 IMPLANT
TOWEL GREEN STERILE FF (TOWEL DISPOSABLE) ×3 IMPLANT
TRAY FOLEY W/BAG SLVR 16FR (SET/KITS/TRAYS/PACK) ×3
TRAY FOLEY W/BAG SLVR 16FR ST (SET/KITS/TRAYS/PACK) ×1 IMPLANT
TRAY LAPAROSCOPIC MC (CUSTOM PROCEDURE TRAY) ×3 IMPLANT
TROCAR XCEL 12X100 BLDLESS (ENDOMECHANICALS) ×3 IMPLANT
TROCAR XCEL NON-BLD 5MMX100MML (ENDOMECHANICALS) ×3 IMPLANT
TUBE CONNECTING 12'X1/4 (SUCTIONS) ×1
TUBE CONNECTING 12X1/4 (SUCTIONS) ×1 IMPLANT
WATER STERILE IRR 1000ML POUR (IV SOLUTION) ×3 IMPLANT
YANKAUER SUCT BULB TIP NO VENT (SUCTIONS) ×2 IMPLANT

## 2019-10-25 NOTE — Anesthesia Procedure Notes (Signed)
Procedure Name: Intubation Date/Time: 10/25/2019 9:44 AM Performed by: Harden Mo, CRNA Pre-anesthesia Checklist: Patient identified, Emergency Drugs available, Suction available and Patient being monitored Patient Re-evaluated:Patient Re-evaluated prior to induction Oxygen Delivery Method: Circle System Utilized Preoxygenation: Pre-oxygenation with 100% oxygen Induction Type: IV induction, Rapid sequence and Cricoid Pressure applied Laryngoscope Size: Miller and 2 Grade View: Grade I Tube type: Oral Tube size: 7.0 mm Number of attempts: 1 Airway Equipment and Method: Stylet and Oral airway Placement Confirmation: ETT inserted through vocal cords under direct vision,  positive ETCO2 and breath sounds checked- equal and bilateral Secured at: 21 cm Tube secured with: Tape Dental Injury: Teeth and Oropharynx as per pre-operative assessment

## 2019-10-25 NOTE — Progress Notes (Signed)
Day of Surgery   Subjective/Chief Complaint: Feels somewhat nauseated pain.  Pain is slightly better than on presentation with medications.   Objective: Vital signs in last 24 hours: Temp:  [98.4 F (36.9 C)-99.1 F (37.3 C)] 99.1 F (37.3 C) (11/24 0739) Pulse Rate:  [104-123] 120 (11/24 0739) Resp:  [17-20] 17 (11/24 0739) BP: (101-133)/(61-76) 101/61 (11/24 0739) SpO2:  [98 %-100 %] 98 % (11/24 0739) Weight:  [108.9 kg] 108.9 kg (11/24 0905) Last BM Date: 10/22/19  Intake/Output from previous day: 11/23 0701 - 11/24 0700 In: 1186 [I.V.:86; IV Piggyback:1100] Out: -  Intake/Output this shift: No intake/output data recorded.  General appearance: Alert and cooperative  Lab Results:  Recent Labs    10/24/19 1626 10/25/19 0313  WBC 22.0* 20.1*  HGB 13.1 11.4*  HCT 40.8 35.4*  PLT 214 181   BMET Recent Labs    10/24/19 1626 10/25/19 0313  NA 136 136  K 3.6 3.8  CL 100 104  CO2 25 22  GLUCOSE 140* 103*  BUN 12 14  CREATININE 1.51* 1.26*  CALCIUM 9.2 8.3*   PT/INR No results for input(s): LABPROT, INR in the last 72 hours. ABG No results for input(s): PHART, HCO3 in the last 72 hours.  Invalid input(s): PCO2, PO2  Studies/Results: Ct Abdomen Pelvis W Contrast  Result Date: 10/24/2019 CLINICAL DATA:  Right lower quadrant pain EXAM: CT ABDOMEN AND PELVIS WITH CONTRAST TECHNIQUE: Multidetector CT imaging of the abdomen and pelvis was performed using the standard protocol following bolus administration of intravenous contrast. CONTRAST:  OMNIPAQUE IOHEXOL 300 MG/ML  SOLN COMPARISON:  None. FINDINGS: Lower chest: Lung bases demonstrate patchy atelectasis at the right base. No consolidation or effusion. The heart size is normal. Hepatobiliary: Subcentimeter hypodensity in the posterior right hepatic lobe too small to further characterize. Possible sludge in the gallbladder. No calcified gallstone or biliary dilatation Pancreas: Unremarkable. No pancreatic  ductal dilatation or surrounding inflammatory changes. Spleen: Normal in size without focal abnormality. Adrenals/Urinary Tract: Adrenal glands are unremarkable. Kidneys are normal, without renal calculi, focal lesion, or hydronephrosis. Bladder is unremarkable. Stomach/Bowel: The stomach is nonenlarged. No dilated small bowel. Fluid filled right colon. Abnormal appendix in the right lower quadrant measuring up to 17 mm with mucosal enhancement. Considerable right lower quadrant inflammatory changes. Small gas and fluid collection adjacent to the appendix. Slightly thickened but nondistended small bowel in the pelvis with mucosal enhancement. Vascular/Lymphatic: Nonaneurysmal aorta. Small right lower quadrant lymph nodes Reproductive: Uterus and bilateral adnexa are unremarkable. Other: Slightly complex fluid in the posterior pelvis, without well-defined rim. Additional foci of fluid within the pelvis and interspersed among small bowel. Mild soft tissue stranding within the mesentery. Musculoskeletal: Posterior rods and fixating screws L4 through S1. IMPRESSION: 1. Findings are suspicious for an acute perforated appendicitis. No organized abscess at this time, however multiple small fluid collections are noted within the lower abdomen and pelvis, posterior pelvic collection is slightly complex. Mild generalized soft tissue stranding in the mesentery consistent with generalized inflammation/peritonitis. Appendix: Location: Retrocecal, right lower quadrant Diameter: 17 mm Appendicolith: Not seen Mucosal hyper-enhancement: Present Extraluminal gas: Present Periappendiceal collection: Small amount of fluid and gas adjacent to the appendix without well-formed abscess. Additional small fluid collections within the lower abdomen and pelvis 2. Possible increased intraluminal density within the gallbladder as may be seen with sludge Critical Value/emergent results were called by telephone at the time of interpretation on  10/24/2019 at 9:33 pm to [facility] , who verbally acknowledged these  results. Electronically Signed   By: Donavan Foil M.D.   On: 10/24/2019 21:33    Anti-infectives: Anti-infectives (From admission, onward)   Start     Dose/Rate Route Frequency Ordered Stop   10/25/19 0500  [MAR Hold]  piperacillin-tazobactam (ZOSYN) IVPB 3.375 g     (MAR Hold since Tue 10/25/2019 at [place].Hold Reason: Transfer to a Procedural area.)   3.375 g 12.5 mL/hr over 240 Minutes Intravenous Every 8 hours 10/24/19 2358     10/24/19 2130  piperacillin-tazobactam (ZOSYN) IVPB 3.375 g     3.375 g 100 mL/hr over 30 Minutes Intravenous  Once 10/24/19 2129 10/24/19 2255      Assessment/Plan: Acute perforated appendicitis without generalized peritonitis Acute kidney injury History of hypertension  Given rising temperature curve and persistent tachycardia despite receiving antibiotics, and absence of drainable abscess, I recommend proceeding with laparoscopic appendectomy. We discussed the surgery including risks of bleeding, infection, pain, scarring, injury to intra-abdominal structures, conversion to open surgery or more extensive resection, risk of staple line leak or delayed abscess, failure to resolve symptoms, postoperative ileus, incisional hernia, as well as general risks of DVT/PE, pneumonia, stroke, heart attack, death.  Her risk of requiring conversion to open surgery, postoperative abscess and postoperative ileus as well as prolonged hospital stay are increased due to duration of symptoms and perforation.  I discussed this with her and she expressed understanding.  Questions were welcomed and answered to the patient's satisfaction. We'll proceed to the operating room today.     LOS: 1 day    Clovis Riley 10/25/2019

## 2019-10-25 NOTE — Progress Notes (Addendum)
08:05 - [facility] to notify doctor of MEWS 2, HR 120, BP 101/61.  Doctor to return my call.  08:35 - Patient transported in bed to surgery; Had not received return call from physician; Reported to [facility] 2, HR 120 and BP 101/61

## 2019-10-25 NOTE — Plan of Care (Signed)

## 2019-10-25 NOTE — Op Note (Signed)
Operative Note  Melanie [name]  [id number]  [id number]  10/25/2019   Surgeon: Victorino Sparrow ConnorMD  Assistant: Jackson Latino PA-C  Procedure performed: laparoscopic appendectomy converted to open ileocecectomy  Preop diagnosis: perforated appendix, sepsis Post-op diagnosis/intraop findings: same, diffuse purulent peritonitis  Specimens: ileocecectomy Retained items: 65fr round blake drain EBL: 49FW Complications: none  Description of procedure: After obtaining informed consent the patient was taken to the operating room and placed supine on operating room table wheregeneral endotracheal anesthesia was initiated, preoperative antibiotics were administered, SCDs applied, and a formal timeout was performed.  Foley catheter was inserted.  The abdomen was prepped and draped.  Peritoneal access was gained via infraumbilical Veress needle, 15 mmHg 5 mm trocar and camera were inserted.  Inspection demonstrated no evidence of intrathoracic, but significant inflammation in the pelvis and right upper quadrant with diffuse peritonitis small bowel and mesentery and free floating pus in the right lower quadrant and pelvis.  A suprapubic and trocar were inserted direct visualization.  Purulent fluid was aspirated and an inflammatory adhesions gently lysed bluntly until the small bowel could be mobilized out of the pelvis and reflected cephalad.  The appendix appeared to be retrocecal and encased in fibrotic inflammatory tissue.  Attempted to mobilize this laparoscopically but was unable to progress safely.  A midline incision was made and the [place] wound protector inserted.  Gentle blunt and cautery dissection were used to mobilize the right colon and terminal ileum along the lateral reflection until the bowel in the the appendix a large perforation along the mid-body, the appendix was densely adherent to the posterolateral sidewall of the cecum.  Care was taken to stay out of the retroperitoneum and  cautery was used only on transparent layers.  Once fully mobilized, a blue load  75 mm GIA stapler was used to transect the ascending colon as well as the terminal ileum.  The intervening mesentery was divided with the LigaSure.  The specimen was handed off.  The mesentery was inspected, hemostasis was excellent.  An ileocolic anastomosis was created with the blue load 75 mm GIA stapler, the common enterotomy closed with a TX 60 blue load.  3-0 silk was placed at the apex of the staple line as well as to invert the corners of the TX 60 staple line.  Small bleeding areas on the staple lines were addressed with 3-0 silks.  On completion the anastomosis is palpably patent, appears viable well perfused and without any tension.  The remaining small bowel was then run confirming no other injury, purulent exudate was removed.  The remaining colon appeared healthy.  The abdomen was then irrigated with several liters of warm sterile saline.  It was brought back down to cover the anastomosis.  A 19 French round Blake drain was placed through our 12 mm trocar site in the left lower quadrant and directed down to the pelvis.  Secured to skin with 2-0 nylon.  The midline incision was closed with running looped #1 PDS starting at either end and tying centrally.  The wound was irrigated, loosely approximated with staples and a Prevena Incisional  Wound VAC dressing applied.  Suprapubic port site were closed with a single staple and Band-Aid applied.  Additional staple placed at the drain site incision and then a gauze dressing applied.  The patient was then awakened, extubated and taken to PACU in stable condition.   All counts were correct at the completion of the case.   I updated her mother, Carlyon Shadow,  at the end of the case.

## 2019-10-25 NOTE — Anesthesia Postprocedure Evaluation (Signed)
Anesthesia Post Note  Patient: Melanie [name]  Procedure(s) Performed: ATTEMPTED LAPAROSCOPIC APPENDECTOMY (N/A Abdomen) OPEN APPENDECTOMY (N/A Abdomen)     Patient location during evaluation: PACU Anesthesia Type: General Level of consciousness: awake and alert Pain management: pain level controlled Vital Signs Assessment: post-procedure vital signs reviewed and stable Respiratory status: spontaneous breathing, nonlabored ventilation, respiratory function stable and patient connected to nasal cannula oxygen Cardiovascular status: blood pressure returned to baseline and stable Postop Assessment: no apparent nausea or vomiting Anesthetic complications: no    Last Vitals:  Vitals:   10/25/19 1247 10/25/19 1601  BP: 112/67 107/61  Pulse: (!) 102 96  Resp: 18 16  Temp:  36.8 C  SpO2: 98% 97%    Last Pain:  Vitals:   10/25/19 1601  TempSrc: Oral  PainSc: 1                  Camarion Weier

## 2019-10-25 NOTE — Transfer of Care (Signed)
Immediate Anesthesia Transfer of Care Note  Patient: Melanie [name]  Procedure(s) Performed: ATTEMPTED LAPAROSCOPIC APPENDECTOMY (N/A Abdomen) OPEN APPENDECTOMY (N/A Abdomen)  Patient Location: PACU  Anesthesia Type:General  Level of Consciousness: awake, alert  and oriented  Airway & Oxygen Therapy: Patient Spontanous Breathing and Patient connected to nasal cannula oxygen  Post-op Assessment: Report given to RN and Post -op Vital signs reviewed and stable  Post vital signs: Reviewed and stable  Last Vitals:  Vitals Value Taken Time  BP 112/64 10/25/19 1136  Temp    Pulse 109 10/25/19 1137  Resp 23 10/25/19 1137  SpO2 99 % 10/25/19 1137  Vitals shown include unvalidated device data.  Last Pain:  Vitals:   10/25/19 0905  TempSrc:   PainSc: 0-No pain      Patients Stated Pain Goal: 2 (37/90/24 0973)  Complications: No apparent anesthesia complications

## 2019-10-25 NOTE — Progress Notes (Signed)
   Vital Signs MEWS/VS Documentation      10/25/2019 1153 10/25/2019 1247 10/25/2019 1601 10/25/2019 1604   MEWS Score:  1  1  0  0   MEWS Score Color:  Green  Green  Green  Green   Resp:  20  18  16   -   Pulse:  (!) 108  (!) 102  96  -   BP:  (!) 120/57  112/67  107/61  -   Temp:  -  -  98.2 F (36.8 C)  -   O2 Device:  Nasal Cannula  Room Air  Room Air  -   O2 Flow Rate (L/min):  2 L/min  -  -  -   Level of Consciousness:  -  -  -  Alert           [employer] 10/25/2019,4:51 PM

## 2019-10-25 NOTE — Anesthesia Preprocedure Evaluation (Addendum)
Anesthesia Evaluation  Patient identified by MRN, date of birth, ID band Patient awake    Reviewed: Allergy & Precautions, H&P , NPO status , Patient's Chart, lab work & pertinent test results  History of Anesthesia Complications Negative for: history of anesthetic complications  Airway Mallampati: I  TM Distance: >3 FB Neck ROM: full    Dental no notable dental hx. (+) Teeth Intact, Dental Advisory Given   Pulmonary neg pulmonary ROS,    Pulmonary exam normal breath sounds clear to auscultation       Cardiovascular hypertension, Pt. on medications Normal cardiovascular exam+ Valvular Problems/Murmurs  Rhythm:regular Rate:Normal     Neuro/Psych negative neurological ROS     GI/Hepatic negative GI ROS, Neg liver ROS,   Endo/Other  Morbid obesity  Renal/GU Renal disease     Musculoskeletal   Abdominal   Peds  Hematology  (+) Blood dyscrasia, anemia ,   Anesthesia Other Findings   Reproductive/Obstetrics negative OB ROS                            Anesthesia Physical  Anesthesia Plan  ASA: III  Anesthesia Plan: General   Post-op Pain Management:    Induction: Intravenous and Cricoid pressure planned  PONV Risk Score and Plan: 3 and Ondansetron, Treatment may vary due to age or medical condition, Dexamethasone and Midazolam  Airway Management Planned: Oral ETT  Additional Equipment: None  Intra-op Plan:   Post-operative Plan: Extubation in OR  Informed Consent: I have reviewed the patients History and Physical, chart, labs and discussed the procedure including the risks, benefits and alternatives for the proposed anesthesia with the patient or authorized representative who has indicated his/her understanding and acceptance.     Dental Advisory Given  Plan Discussed with: Anesthesiologist, CRNA and Surgeon  Anesthesia Plan Comments:         Anesthesia Quick  Evaluation

## 2019-10-26 ENCOUNTER — Encounter (HOSPITAL_COMMUNITY): Payer: Self-pay | Admitting: Surgery

## 2019-10-26 LAB — CBC
HCT: 31.6 % — ABNORMAL LOW (ref 36.0–46.0)
Hemoglobin: 10.2 g/dL — ABNORMAL LOW (ref 12.0–15.0)
MCH: 27.6 pg (ref 26.0–34.0)
MCHC: 32.3 g/dL (ref 30.0–36.0)
MCV: 85.6 fL (ref 80.0–100.0)
Platelets: 180 10*3/uL (ref 150–400)
RBC: 3.69 MIL/uL — ABNORMAL LOW (ref 3.87–5.11)
RDW: 15.3 % (ref 11.5–15.5)
WBC: 17.7 10*3/uL — ABNORMAL HIGH (ref 4.0–10.5)
nRBC: 0 % (ref 0.0–0.2)

## 2019-10-26 LAB — BASIC METABOLIC PANEL
Anion gap: 9 (ref 5–15)
BUN: 14 mg/dL (ref 6–20)
CO2: 23 mmol/L (ref 22–32)
Calcium: 8.2 mg/dL — ABNORMAL LOW (ref 8.9–10.3)
Chloride: 105 mmol/L (ref 98–111)
Creatinine, Ser: 1.14 mg/dL — ABNORMAL HIGH (ref 0.44–1.00)
GFR calc Af Amer: >60 mL/min (ref >60)
GFR calc non Af Amer: >60 mL/min (ref >60)
Glucose, Bld: 112 mg/dL — ABNORMAL HIGH (ref 70–99)
Potassium: 3.9 mmol/L (ref 3.5–5.1)
Sodium: 137 mmol/L (ref 135–145)

## 2019-10-26 LAB — SURGICAL PATHOLOGY

## 2019-10-26 MED ORDER — CHLORHEXIDINE GLUCONATE CLOTH 2 % EX PADS
6.0000 | MEDICATED_PAD | Freq: Every day | CUTANEOUS | Status: DC
  Administered 2019-10-26 – 2019-10-30 (×5): 6 via TOPICAL

## 2019-10-26 MED ORDER — ENOXAPARIN SODIUM 40 MG/0.4ML ~~LOC~~ SOLN
40.0000 mg | SUBCUTANEOUS | Status: DC
  Administered 2019-10-26 – 2019-10-27 (×2): 40 mg via SUBCUTANEOUS
  Filled 2019-10-26 (×2): qty 0.4

## 2019-10-26 MED ORDER — SODIUM CHLORIDE 0.9 % IV SOLN
INTRAVENOUS | Status: DC | PRN
  Administered 2019-10-26 – 2019-10-29 (×2): 250 mL via INTRAVENOUS

## 2019-10-26 MED ORDER — METHOCARBAMOL 1000 MG/10ML IJ SOLN
500.0000 mg | Freq: Four times a day (QID) | INTRAVENOUS | Status: DC
  Administered 2019-10-26 – 2019-10-29 (×12): 500 mg via INTRAVENOUS
  Filled 2019-10-26 (×4): qty 5
  Filled 2019-10-26: qty 500
  Filled 2019-10-26 (×3): qty 5
  Filled 2019-10-26 (×2): qty 500
  Filled 2019-10-26 (×4): qty 5
  Filled 2019-10-26: qty 500
  Filled 2019-10-26: qty 5

## 2019-10-26 MED ORDER — ACETAMINOPHEN 10 MG/ML IV SOLN
1000.0000 mg | Freq: Four times a day (QID) | INTRAVENOUS | Status: AC
  Administered 2019-10-26 – 2019-10-27 (×4): 1000 mg via INTRAVENOUS
  Filled 2019-10-26 (×5): qty 100

## 2019-10-26 MED ORDER — MENTHOL 3 MG MT LOZG: 1.0000 | LOZENGE | OROMUCOSAL | Status: DC | PRN

## 2019-10-26 MED ORDER — PHENOL 1.4 % MT LIQD
1.0000 | OROMUCOSAL | Status: DC | PRN
  Administered 2019-10-26: 1 via OROMUCOSAL
  Filled 2019-10-26: qty 177

## 2019-10-26 NOTE — Progress Notes (Addendum)
[facility]/Trauma Progress Note  1 Day Post-Op   Assessment/Plan HTN - holding home meds, BP has been soft  Acute perforated appendicitis with diffuse purulent peritonitis - s/p  laparoscopic appendectomy converted to open ileocecectomy, Dr. Fredricka Bonine, 11/24 Acute kidney injury - improving, continue IVF Sepsis - improved, VSS ABLA - Hgb 13.1 on admission, today 11.4, monitor  FEN: NPO, NGT until return of bowel function VTE: SCD's, lovenox ID: Zosyn 11/24>>  WBC down to 17.7 Foley: yes continue until POD#2 Follow up: Dr. Fredricka Bonine  DISPO: await return of bowel function, ambulate, pain control, IS, IV tylenol  Addendum: spoke to Husband, Tresa Res    LOS: 2 days    Subjective: CC: abdominal pain  No issues overnight per pt. Her throat is very irritated with the NGT and she states it is painful to swallow. No flatus yet. No BM. She states the pain medicine is helping her pain. I discussed the importance of the IS.   Objective: Vital signs in last 24 hours: Temp:  [97.8 F (36.6 C)-98.5 F (36.9 C)] 98.5 F (36.9 C) (11/25 0550) Pulse Rate:  [94-113] 95 (11/25 0550) Resp:  [16-20] 18 (11/24 2013) BP: (107-120)/(57-69) 111/69 (11/25 0550) SpO2:  [93 %-100 %] 93 % (11/25 0550) Last BM Date: 10/23/19  Intake/Output from previous day: 11/24 0701 - 11/25 0700 In: 1714 [I.V.:1397.7; IV Piggyback:316.3] Out: 850 [Urine:585; Drains:165; Blood:100] Intake/Output this shift: No intake/output data recorded.  PE:  Gen:  Alert, NAD, pleasant, cooperative Card:  RRR, no M/G/R heard Pulm:  CTA, no W/R/R, rate and effort normal Abd: Soft, obese, ND, hypoactive BS, prevena in place of midline wound, JP drain is serous, TTP of left hemiabdomen without guarding. No peritontiis  Skin: no rashes noted, warm and dry   Anti-infectives: Anti-infectives (From admission, onward)   Start     Dose/Rate Route Frequency Ordered Stop   10/25/19 0500  piperacillin-tazobactam  (ZOSYN) IVPB 3.375 g     3.375 g 12.5 mL/hr over 240 Minutes Intravenous Every 8 hours 10/24/19 2358     10/24/19 2130  piperacillin-tazobactam (ZOSYN) IVPB 3.375 g     3.375 g 100 mL/hr over 30 Minutes Intravenous  Once 10/24/19 2129 10/24/19 2255      Lab Results:  Recent Labs    10/25/19 0313 10/26/19 0340  WBC 20.1* 17.7*  HGB 11.4* 10.2*  HCT 35.4* 31.6*  PLT 181 180   BMET Recent Labs    10/25/19 0313 10/26/19 0340  NA 136 137  K 3.8 3.9  CL 104 105  CO2 22 23  GLUCOSE 103* 112*  BUN 14 14  CREATININE 1.26* 1.14*  CALCIUM 8.3* 8.2*   PT/INR No results for input(s): LABPROT, INR in the last 72 hours. CMP     Component Value Date/Time   NA 137 10/26/2019 0340   K 3.9 10/26/2019 0340   CL 105 10/26/2019 0340   CO2 23 10/26/2019 0340   GLUCOSE 112 (H) 10/26/2019 0340   BUN 14 10/26/2019 0340   CREATININE 1.14 (H) 10/26/2019 0340   CALCIUM 8.2 (L) 10/26/2019 0340   PROT 8.4 (H) 10/24/2019 1626   ALBUMIN 3.7 10/24/2019 1626   AST 19 10/24/2019 1626   ALT 13 10/24/2019 1626   ALKPHOS 52 10/24/2019 1626   BILITOT 1.7 (H) 10/24/2019 1626   GFRNONAA >60 10/26/2019 0340   GFRAA >60 10/26/2019 0340   Lipase     Component Value Date/Time   LIPASE 21 10/24/2019 1626  Studies/Results: Ct Abdomen Pelvis W Contrast  Result Date: 10/24/2019 CLINICAL DATA:  Right lower quadrant pain EXAM: CT ABDOMEN AND PELVIS WITH CONTRAST TECHNIQUE: Multidetector CT imaging of the abdomen and pelvis was performed using the standard protocol following bolus administration of intravenous contrast. CONTRAST:  167mL OMNIPAQUE IOHEXOL 300 MG/ML  SOLN COMPARISON:  None. FINDINGS: Lower chest: Lung bases demonstrate patchy atelectasis at the right base. No consolidation or effusion. The heart size is normal. Hepatobiliary: Subcentimeter hypodensity in the posterior right hepatic lobe too small to further characterize. Possible sludge in the gallbladder. No calcified gallstone or  biliary dilatation Pancreas: Unremarkable. No pancreatic ductal dilatation or surrounding inflammatory changes. Spleen: Normal in size without focal abnormality. Adrenals/Urinary Tract: Adrenal glands are unremarkable. Kidneys are normal, without renal calculi, focal lesion, or hydronephrosis. Bladder is unremarkable. Stomach/Bowel: The stomach is nonenlarged. No dilated small bowel. Fluid filled right colon. Abnormal appendix in the right lower quadrant measuring up to 17 mm with mucosal enhancement. Considerable right lower quadrant inflammatory changes. Small gas and fluid collection adjacent to the appendix. Slightly thickened but nondistended small bowel in the pelvis with mucosal enhancement. Vascular/Lymphatic: Nonaneurysmal aorta. Small right lower quadrant lymph nodes Reproductive: Uterus and bilateral adnexa are unremarkable. Other: Slightly complex fluid in the posterior pelvis, without well-defined rim. Additional foci of fluid within the pelvis and interspersed among small bowel. Mild soft tissue stranding within the mesentery. Musculoskeletal: Posterior rods and fixating screws L4 through S1. IMPRESSION: 1. Findings are suspicious for an acute perforated appendicitis. No organized abscess at this time, however multiple small fluid collections are noted within the lower abdomen and pelvis, posterior pelvic collection is slightly complex. Mild generalized soft tissue stranding in the mesentery consistent with generalized inflammation/peritonitis. Appendix: Location: Retrocecal, right lower quadrant Diameter: 17 mm Appendicolith: Not seen Mucosal hyper-enhancement: Present Extraluminal gas: Present Periappendiceal collection: Small amount of fluid and gas adjacent to the appendix without well-formed abscess. Additional small fluid collections within the lower abdomen and pelvis 2. Possible increased intraluminal density within the gallbladder as may be seen with sludge Critical Value/emergent results were  called by telephone at the time of interpretation on 10/24/2019 at 9:33 pm to [facility] , who verbally acknowledged these results. Electronically Signed   By: Donavan Foil M.D.   On: 10/24/2019 21:33     Kalman Drape, [facility] Please see amion for pager for the following: Cristine Polio, & Friday 7:00am - 4:30pm Thursdays 7:00am -11:30am

## 2019-10-26 NOTE — Plan of Care (Signed)

## 2019-10-27 LAB — CBC
HCT: 28.2 % — ABNORMAL LOW (ref 36.0–46.0)
Hemoglobin: 9.1 g/dL — ABNORMAL LOW (ref 12.0–15.0)
MCH: 27.4 pg (ref 26.0–34.0)
MCHC: 32.3 g/dL (ref 30.0–36.0)
MCV: 84.9 fL (ref 80.0–100.0)
Platelets: 191 10*3/uL (ref 150–400)
RBC: 3.32 MIL/uL — ABNORMAL LOW (ref 3.87–5.11)
RDW: 15.5 % (ref 11.5–15.5)
WBC: 16.1 10*3/uL — ABNORMAL HIGH (ref 4.0–10.5)
nRBC: 0.1 % (ref 0.0–0.2)

## 2019-10-27 LAB — BASIC METABOLIC PANEL
Anion gap: 12 (ref 5–15)
BUN: 12 mg/dL (ref 6–20)
CO2: 23 mmol/L (ref 22–32)
Calcium: 7.9 mg/dL — ABNORMAL LOW (ref 8.9–10.3)
Chloride: 103 mmol/L (ref 98–111)
Creatinine, Ser: 1 mg/dL (ref 0.44–1.00)
GFR calc Af Amer: >60 mL/min (ref >60)
GFR calc non Af Amer: >60 mL/min (ref >60)
Glucose, Bld: 92 mg/dL (ref 70–99)
Potassium: 3.3 mmol/L — ABNORMAL LOW (ref 3.5–5.1)
Sodium: 138 mmol/L (ref 135–145)

## 2019-10-27 MED ORDER — POTASSIUM CHLORIDE 10 MEQ/100ML IV SOLN
10.0000 meq | INTRAVENOUS | Status: AC
  Administered 2019-10-27 (×4): 10 meq via INTRAVENOUS
  Filled 2019-10-27 (×3): qty 100

## 2019-10-27 MED ORDER — POTASSIUM CHLORIDE 10 MEQ/100ML IV SOLN
10.0000 meq | INTRAVENOUS | Status: AC
  Administered 2019-10-27 (×2): 10 meq via INTRAVENOUS
  Filled 2019-10-27 (×3): qty 100

## 2019-10-27 MED ORDER — ACETAMINOPHEN 10 MG/ML IV SOLN
1000.0000 mg | Freq: Four times a day (QID) | INTRAVENOUS | Status: AC
  Administered 2019-10-27 (×2): 1000 mg via INTRAVENOUS
  Filled 2019-10-27 (×3): qty 100

## 2019-10-27 NOTE — Plan of Care (Signed)

## 2019-10-27 NOTE — Progress Notes (Signed)
2 Days Post-Op  Subjective: CC: No issues overnight. Tolerating ice chips without, n/v. Abdominal pain improved overnight. None currently. She has not passed flatus or had a BM. She has foley in place. She has mobilized out of bed.   Objective: Vital signs in last 24 hours: Temp:  [98 F (36.7 C)-98.2 F (36.8 C)] 98 F (36.7 C) (11/26 0739) Pulse Rate:  [85-93] 93 (11/26 0739) Resp:  [17-18] 18 (11/26 0739) BP: (115-132)/(72-80) 124/72 (11/26 0739) SpO2:  [92 %-100 %] 94 % (11/26 0739) Last BM Date: 10/23/19  Intake/Output from previous day: 11/25 0701 - 11/26 0700 In: 287.1 [I.V.:0.1; IV Piggyback:287.1] Out: 770 [Urine:750; Drains:20] Intake/Output this shift: Total I/O In: 120 [P.O.:120] Out: -   PE: Gen:  Alert, NAD, pleasant, cooperative Card:  RRR, no M/G/R heard Pulm:  CTA, no W/R/R, rate and effort normal Abd: Soft, obese, ND, hypoactive BS, prevena in place of midline wound, JP drain is serous, essentially NT. No peritontiis  Skin: no rashes noted, warm and dry  Lab Results:  Recent Labs    10/26/19 0340 10/27/19 0414  WBC 17.7* 16.1*  HGB 10.2* 9.1*  HCT 31.6* 28.2*  PLT 180 191   BMET Recent Labs    10/26/19 0340 10/27/19 0414  NA 137 138  K 3.9 3.3*  CL 105 103  CO2 23 23  GLUCOSE 112* 92  BUN 14 12  CREATININE 1.14* 1.00  CALCIUM 8.2* 7.9*   PT/INR No results for input(s): LABPROT, INR in the last 72 hours. CMP     Component Value Date/Time   NA 138 10/27/2019 0414   K 3.3 (L) 10/27/2019 0414   CL 103 10/27/2019 0414   CO2 23 10/27/2019 0414   GLUCOSE 92 10/27/2019 0414   BUN 12 10/27/2019 0414   CREATININE 1.00 10/27/2019 0414   CALCIUM 7.9 (L) 10/27/2019 0414   PROT 8.4 (H) 10/24/2019 1626   ALBUMIN 3.7 10/24/2019 1626   AST 19 10/24/2019 1626   ALT 13 10/24/2019 1626   ALKPHOS 52 10/24/2019 1626   BILITOT 1.7 (H) 10/24/2019 1626   GFRNONAA >60 10/27/2019 0414   GFRAA >60 10/27/2019 0414   Lipase     Component  Value Date/Time   LIPASE 21 10/24/2019 1626       Studies/Results: No results found.  Anti-infectives: Anti-infectives (From admission, onward)   Start     Dose/Rate Route Frequency Ordered Stop   10/25/19 0500  piperacillin-tazobactam (ZOSYN) IVPB 3.375 g     3.375 g 12.5 mL/hr over 240 Minutes Intravenous Every 8 hours 10/24/19 2358     10/24/19 2130  piperacillin-tazobactam (ZOSYN) IVPB 3.375 g     3.375 g 100 mL/hr over 30 Minutes Intravenous  Once 10/24/19 2129 10/24/19 2255       Assessment/Plan HTN - holding home meds, BP has been soft  Acute perforated appendicitis with diffuse purulent peritonitis - s/p laparoscopic appendectomy converted to open ileocecectomy, Dr. Fredricka Bonine, 11/24 - POD #2. Prevena Incisional Wound VAC.  Acute kidney injury - Resolved Sepsis - Improved, VSS ABLA - Hgb 13.1 on admission, 9.1 today, monitor  FEN: Sips and Chips from the floor, IVF VTE: SCD's, lovenox ID: Zosyn 11/24>>  WBC down to 16.1 Foley: D/c today  Follow up: Dr. Fredricka Bonine POC - Mother is at bedside and updated on place.  DISPO: await return of bowel function, ambulate, pain control, IS, IV tylenol    LOS: 3 days    Jacinto Halim ,  [facility] 10/27/2019, 10:23 AM Please see Amion for pager number during day hours 7:00am-4:30pm

## 2019-10-28 LAB — CBC
HCT: 27.2 % — ABNORMAL LOW (ref 36.0–46.0)
Hemoglobin: 9 g/dL — ABNORMAL LOW (ref 12.0–15.0)
MCH: 28 pg (ref 26.0–34.0)
MCHC: 33.1 g/dL (ref 30.0–36.0)
MCV: 84.5 fL (ref 80.0–100.0)
Platelets: 189 10*3/uL (ref 150–400)
RBC: 3.22 MIL/uL — ABNORMAL LOW (ref 3.87–5.11)
RDW: 15.7 % — ABNORMAL HIGH (ref 11.5–15.5)
WBC: 13.5 10*3/uL — ABNORMAL HIGH (ref 4.0–10.5)
nRBC: 0.1 % (ref 0.0–0.2)

## 2019-10-28 MED ORDER — MORPHINE SULFATE (PF) 2 MG/ML IV SOLN: 2.0000 mg | INTRAVENOUS | Status: DC | PRN

## 2019-10-28 MED ORDER — ACETAMINOPHEN 325 MG PO TABS
650.0000 mg | ORAL_TABLET | Freq: Four times a day (QID) | ORAL | Status: DC
  Administered 2019-10-28 – 2019-10-30 (×7): 650 mg via ORAL
  Filled 2019-10-28 (×8): qty 2

## 2019-10-28 MED ORDER — OXYCODONE HCL 5 MG PO TABS
5.0000 mg | ORAL_TABLET | ORAL | Status: DC | PRN
  Filled 2019-10-28: qty 1

## 2019-10-28 NOTE — Progress Notes (Signed)
On-call provider for Melanie [name] notified of patient having large loose bloody stool. Patient is asymptomatic and VSS at this time. Hemoglobin is 9.0 and was 9.1.  Ordered to hold next dose of Lovenox and monitor patient's status.

## 2019-10-28 NOTE — Progress Notes (Addendum)
3 Days Post-Op  Subjective: CC: Patient reports she had 4 bm's yesterday. 2 normal, formed bm's and 2 dark maroon bm's later in the evening/early this am. She denies lightheadedness/diziness. Hgb stable. No tachycardia or hypotension. She denies any currently abdominal pain, n/v. She is tolerating clears from the floor. She mobilized to and from the bathroom but not in the halls yet. Voiding without difficulty since foley was removed.   Objective: Vital signs in last 24 hours: Temp:  [98.5 F (36.9 C)-99.6 F (37.6 C)] 98.5 F (36.9 C) (11/27 0758) Pulse Rate:  [76-91] 76 (11/27 0758) Resp:  [17-19] 18 (11/27 0758) BP: (131-141)/(86-90) 139/89 (11/27 0758) SpO2:  [95 %-99 %] 99 % (11/27 0758) Last BM Date: 10/23/19  Intake/Output from previous day: 11/26 0701 - 11/27 0700 In: 9485 [P.O.:240; I.V.:4337.4; IV Piggyback:1217.5] Out: 520 [Urine:500; Drains:20] Intake/Output this shift: No intake/output data recorded.  PE: Gen: Alert, NAD, pleasant, cooperative Card: RRR, no M/G/R heard Pulm: CTA, no W/R/R, rate andeffort normal Abd: Soft,obese, ND, hypoactive BS, prevena in place of midline wound, JP drain is serous, essentially NT. No peritontiis Skin: no rashes noted, warm and dry  Lab Results:  Recent Labs    10/27/19 0414 10/28/19 0345  WBC 16.1* 13.5*  HGB 9.1* 9.0*  HCT 28.2* 27.2*  PLT 191 189   BMET Recent Labs    10/26/19 0340 10/27/19 0414  NA 137 138  K 3.9 3.3*  CL 105 103  CO2 23 23  GLUCOSE 112* 92  BUN 14 12  CREATININE 1.14* 1.00  CALCIUM 8.2* 7.9*   PT/INR No results for input(s): LABPROT, INR in the last 72 hours. CMP     Component Value Date/Time   NA 138 10/27/2019 0414   K 3.3 (L) 10/27/2019 0414   CL 103 10/27/2019 0414   CO2 23 10/27/2019 0414   GLUCOSE 92 10/27/2019 0414   BUN 12 10/27/2019 0414   CREATININE 1.00 10/27/2019 0414   CALCIUM 7.9 (L) 10/27/2019 0414   PROT 8.4 (H) 10/24/2019 1626   ALBUMIN 3.7 10/24/2019  1626   AST 19 10/24/2019 1626   ALT 13 10/24/2019 1626   ALKPHOS 52 10/24/2019 1626   BILITOT 1.7 (H) 10/24/2019 1626   GFRNONAA >60 10/27/2019 0414   GFRAA >60 10/27/2019 0414   Lipase     Component Value Date/Time   LIPASE 21 10/24/2019 1626       Studies/Results: No results found.  Anti-infectives: Anti-infectives (From admission, onward)   Start     Dose/Rate Route Frequency Ordered Stop   10/25/19 0500  piperacillin-tazobactam (ZOSYN) IVPB 3.375 g     3.375 g 12.5 mL/hr over 240 Minutes Intravenous Every 8 hours 10/24/19 2358     10/24/19 2130  piperacillin-tazobactam (ZOSYN) IVPB 3.375 g     3.375 g 100 mL/hr over 30 Minutes Intravenous  Once 10/24/19 2129 10/24/19 2255       Assessment/Plan HTN - restart  Acute perforated appendicitiswith diffuse purulent peritonitis - s/plaparoscopic appendectomy converted to open ileocecectomy, Dr. Kae Heller, 11/24 - POD #3. Prevena Incisional Wound VAC.  Acute kidney injury- Resolved Sepsis- Improved, VSS ABLA- Hgb 9.0 from 9.1. Monitor. Bloody BM's, hold Lovenox   FEN: FLD,  VTE: SCD's, lovenox IO:EVOJJ 11/24>>WBC down to 13.5 Foley:D/c POD #2 Follow up:Dr. Kae Heller POC - Discussed with Mother yesterday while she was in the room  DISPO:Adv diet ambulate, pain control, IS. Maintain JP   LOS: 4 days    Jillyn Ledger ,  [facility] Surgery 10/28/2019, 8:54 AM Please see Amion for pager number during day hours 7:00am-4:30pm

## 2019-10-29 LAB — BASIC METABOLIC PANEL
Anion gap: 9 (ref 5–15)
BUN: 6 mg/dL (ref 6–20)
CO2: 22 mmol/L (ref 22–32)
Calcium: 8.2 mg/dL — ABNORMAL LOW (ref 8.9–10.3)
Chloride: 107 mmol/L (ref 98–111)
Creatinine, Ser: 0.75 mg/dL (ref 0.44–1.00)
GFR calc Af Amer: >60 mL/min (ref >60)
GFR calc non Af Amer: >60 mL/min (ref >60)
Glucose, Bld: 106 mg/dL — ABNORMAL HIGH (ref 70–99)
Potassium: 3.7 mmol/L (ref 3.5–5.1)
Sodium: 138 mmol/L (ref 135–145)

## 2019-10-29 LAB — CBC
HCT: 25.3 % — ABNORMAL LOW (ref 36.0–46.0)
Hemoglobin: 8.1 g/dL — ABNORMAL LOW (ref 12.0–15.0)
MCH: 26.9 pg (ref 26.0–34.0)
MCHC: 32 g/dL (ref 30.0–36.0)
MCV: 84.1 fL (ref 80.0–100.0)
Platelets: 201 10*3/uL (ref 150–400)
RBC: 3.01 MIL/uL — ABNORMAL LOW (ref 3.87–5.11)
RDW: 16.1 % — ABNORMAL HIGH (ref 11.5–15.5)
WBC: 12.7 10*3/uL — ABNORMAL HIGH (ref 4.0–10.5)
nRBC: 0.2 % (ref 0.0–0.2)

## 2019-10-29 MED ORDER — METHOCARBAMOL 500 MG PO TABS
500.0000 mg | ORAL_TABLET | Freq: Four times a day (QID) | ORAL | Status: DC
  Administered 2019-10-29 – 2019-10-30 (×4): 500 mg via ORAL
  Filled 2019-10-29 (×5): qty 1

## 2019-10-29 MED ORDER — AMOXICILLIN-POT CLAVULANATE 875-125 MG PO TABS
1.0000 | ORAL_TABLET | Freq: Two times a day (BID) | ORAL | Status: DC
  Administered 2019-10-29 – 2019-10-30 (×3): 1 via ORAL
  Filled 2019-10-29 (×3): qty 1

## 2019-10-29 MED ORDER — HYDROCHLOROTHIAZIDE 25 MG PO TABS
12.5000 mg | ORAL_TABLET | Freq: Every day | ORAL | Status: DC
  Administered 2019-10-29 – 2019-10-30 (×2): 12.5 mg via ORAL
  Filled 2019-10-29 (×2): qty 1

## 2019-10-29 MED ORDER — PANTOPRAZOLE SODIUM 40 MG PO TBEC
40.0000 mg | DELAYED_RELEASE_TABLET | Freq: Every day | ORAL | Status: DC
  Administered 2019-10-29 – 2019-10-30 (×2): 40 mg via ORAL
  Filled 2019-10-29 (×2): qty 1

## 2019-10-29 NOTE — Progress Notes (Signed)
Patient ID: Melanie [name], female   DOB: 1980/12/25, 38 y.o.   MRN: [id number]    4 Days Post-Op  Subjective: Feels well today.  Tolerating full liquids and hungry.  Had a BM this morning with dark, black looking stool.  Nothing red or bright red.  ROS: See above, otherwise other systems negative  Objective: Vital signs in last 24 hours: Temp:  [98.4 F (36.9 C)-98.8 F (37.1 C)] 98.5 F (36.9 C) (11/28 0828) Pulse Rate:  [75-88] 75 (11/28 0828) Resp:  [17] 17 (11/28 0828) BP: (132-143)/(86-99) 143/95 (11/28 0828) SpO2:  [99 %-100 %] 100 % (11/28 0828) Last BM Date: 10/28/19  Intake/Output from previous day: 11/27 0701 - 11/28 0700 In: 1325.2 [P.O.:480; I.V.:695.2; IV Piggyback:150] Out: 0  Intake/Output this shift: Total I/O In: -  Out: 18 [Drains:18]  PE: Heart: regular Lungs: CTAB Abd: soft, prevena VAC in place, JP drain with serosang output, great BS  Lab Results:  Recent Labs    10/28/19 0345 10/29/19 0350  WBC 13.5* 12.7*  HGB 9.0* 8.1*  HCT 27.2* 25.3*  PLT 189 201   BMET Recent Labs    10/27/19 0414 10/29/19 0350  NA 138 138  K 3.3* 3.7  CL 103 107  CO2 23 22  GLUCOSE 92 106*  BUN 12 6  CREATININE 1.00 0.75  CALCIUM 7.9* 8.2*   PT/INR No results for input(s): LABPROT, INR in the last 72 hours. CMP     Component Value Date/Time   NA 138 10/29/2019 0350   K 3.7 10/29/2019 0350   CL 107 10/29/2019 0350   CO2 22 10/29/2019 0350   GLUCOSE 106 (H) 10/29/2019 0350   BUN 6 10/29/2019 0350   CREATININE 0.75 10/29/2019 0350   CALCIUM 8.2 (L) 10/29/2019 0350   PROT 8.4 (H) 10/24/2019 1626   ALBUMIN 3.7 10/24/2019 1626   AST 19 10/24/2019 1626   ALT 13 10/24/2019 1626   ALKPHOS 52 10/24/2019 1626   BILITOT 1.7 (H) 10/24/2019 1626   GFRNONAA >60 10/29/2019 0350   GFRAA >60 10/29/2019 0350   Lipase     Component Value Date/Time   LIPASE 21 10/24/2019 1626       Studies/Results: No results found.  Anti-infectives: Anti-infectives  (From admission, onward)   Start     Dose/Rate Route Frequency Ordered Stop   10/29/19 1030  amoxicillin-clavulanate (AUGMENTIN) 875-125 MG per tablet 1 tablet     1 tablet Oral Every 12 hours 10/29/19 1021     10/25/19 0500  piperacillin-tazobactam (ZOSYN) IVPB 3.375 g  Status:  Discontinued     3.375 g 12.5 mL/hr over 240 Minutes Intravenous Every 8 hours 10/24/19 2358 10/29/19 1021   10/24/19 2130  piperacillin-tazobactam (ZOSYN) IVPB 3.375 g     3.375 g 100 mL/hr over 30 Minutes Intravenous  Once 10/24/19 2129 10/24/19 2255       Assessment/Plan HTN - restart home meds Acute kidney injury- Resolved Sepsis-Improved, VSS ABLA- Hgb 9 from 9, hold lovenox  POD 4, s/plaparoscopic appendectomy converted to open ileocecectomy, Dr. Kae Heller, for acute perforated appendicitis with diffuse purulent peritonitis -Prevena Incisional Wound VAC.plan to DC prior to discharge -like some oozing from staple line of anastomosis given bloody BMs.  Seems to be stopping.  -check CBC in am, if stable likely plan for DC home -adv to regular diet -convert meds to po including zosyn to augment -mobilize -pulm toilet -can likely DC JP prior to DC, but will D/W MD  [facility]:ENIDPOE diet  VTE: SCD's, lovenox SK:AJGOT 11/24>>11/28WBC down to 12.7, Augmentin 11/28 >> Foley:D/c POD #2 Follow up:Dr. Fredricka Bonine DISPO:Adv diet, follow hgb, hopefully home tomorrow if hgb stable   LOS: 5 days    Letha Cape , [facility] Surgery 10/29/2019, 10:21 AM Please see Amion for pager number during day hours 7:00am-4:30pm

## 2019-10-29 NOTE — Plan of Care (Signed)
  Problem: Clinical Measurements: Goal: Will remain free from infection Outcome: Progressing   Problem: Pain Managment: Goal: General experience of comfort will improve Outcome: Progressing   Problem: Safety: Goal: Ability to remain free from injury will improve Outcome: Progressing   Problem: Skin Integrity: Goal: Risk for impaired skin integrity will decrease Outcome: Progressing   

## 2019-10-30 LAB — CBC
HCT: 26.9 % — ABNORMAL LOW (ref 36.0–46.0)
Hemoglobin: 8.7 g/dL — ABNORMAL LOW (ref 12.0–15.0)
MCH: 27.2 pg (ref 26.0–34.0)
MCHC: 32.3 g/dL (ref 30.0–36.0)
MCV: 84.1 fL (ref 80.0–100.0)
Platelets: 226 10*3/uL (ref 150–400)
RBC: 3.2 MIL/uL — ABNORMAL LOW (ref 3.87–5.11)
RDW: 15.9 % — ABNORMAL HIGH (ref 11.5–15.5)
WBC: 13.7 10*3/uL — ABNORMAL HIGH (ref 4.0–10.5)
nRBC: 0.7 % — ABNORMAL HIGH (ref 0.0–0.2)

## 2019-10-30 MED ORDER — METHOCARBAMOL 500 MG PO TABS: 500.0000 mg | ORAL_TABLET | Freq: Four times a day (QID) | ORAL | 0 refills | Status: DC

## 2019-10-30 MED ORDER — OXYCODONE HCL 5 MG PO TABS: 5.0000 mg | ORAL_TABLET | ORAL | 0 refills | Status: DC | PRN

## 2019-10-30 MED ORDER — AMOXICILLIN-POT CLAVULANATE 875-125 MG PO TABS: 1.0000 | ORAL_TABLET | Freq: Two times a day (BID) | ORAL | 0 refills | Status: DC

## 2019-10-30 MED ORDER — ACETAMINOPHEN 325 MG PO TABS: 650.0000 mg | ORAL_TABLET | Freq: Four times a day (QID) | ORAL | 0 refills | Status: DC

## 2019-10-30 NOTE — Discharge Instructions (Signed)
[facility]      [facility] [phone]  OPEN ABDOMINAL SURGERY: POST OP INSTRUCTIONS  Always review your discharge instruction sheet given to you by the facility where your surgery was performed.  IF YOU HAVE DISABILITY OR FAMILY LEAVE FORMS, YOU MUST BRING THEM TO THE OFFICE FOR PROCESSING.  PLEASE DO NOT GIVE THEM TO YOUR DOCTOR.  1. A prescription for pain medication may be given to you upon discharge.  Take your pain medication as prescribed, if needed.  If narcotic pain medicine is not needed, then you may take acetaminophen (Tylenol) or ibuprofen (Advil) as needed. 2. Take your usually prescribed medications unless otherwise directed. 3. If you need a refill on your pain medication, please contact your pharmacy. They will contact our office to request authorization.  Prescriptions will not be filled after 5pm or on week-ends. 4. You should follow a light diet the first few days after arrival home, such as soup and crackers, pudding, etc.unless your doctor has advised otherwise. A high-fiber, low fat diet can be resumed as tolerated.   Be sure to include lots of fluids daily. Most patients will experience some swelling and bruising on the chest and neck area.  Ice packs will help.  Swelling and bruising can take several days to resolve 5. Most patients will experience some swelling and bruising in the area of the incision. Ice pack will help. Swelling and bruising can take several days to resolve..  6. It is common to experience some constipation if taking pain medication after surgery.  Increasing fluid intake and taking a stool softener will usually help or prevent this problem from occurring.  A mild laxative (Milk of Magnesia or Miralax) should be taken according to package directions if there are no bowel movements after 48 hours. 7.  You may have steri-strips (small skin tapes) in place directly over the incision.  These strips should be left on the skin for 7-10 days.  If your  surgeon used skin glue on the incision, you may shower in 24 hours.  The glue will flake off over the next 2-3 weeks.  Any sutures or staples will be removed at the office during your follow-up visit. You may find that a light gauze bandage over your incision may keep your staples from being rubbed or pulled. You may shower and replace the bandage daily. 8. ACTIVITIES:  You may resume regular (light) daily activities beginning the next day--such as daily self-care, walking, climbing stairs--gradually increasing activities as tolerated.  You may have sexual intercourse when it is comfortable.  Refrain from any heavy lifting or straining until approved by your doctor. a. You may drive when you no longer are taking prescription pain medication, you can comfortably wear a seatbelt, and you can safely maneuver your car and apply brakes b. Return to Work: ___________________________________ 9. You should see your doctor in the office for a follow-up appointment approximately two weeks after your surgery.  Make sure that you call for this appointment within a day or two after you arrive home to insure a convenient appointment time. OTHER INSTRUCTIONS:  _____________________________________________________________ _____________________________________________________________  WHEN TO CALL YOUR DOCTOR: 1. Fever over 101.0 2. Inability to urinate 3. Nausea and/or vomiting 4. Extreme swelling or bruising 5. Continued bleeding from incision. 6. Increased pain, redness, or drainage from the incision. 7. Difficulty swallowing or breathing 8. Muscle cramping or spasms. 9. Numbness or tingling in hands or feet or around lips.  The clinic staff is available to   answer your questions during regular business hours.  Please don't hesitate to call and ask to speak to one of the nurses if you have concerns.  For further questions, please visit [email or web address]   

## 2019-10-30 NOTE — Plan of Care (Signed)
Pt pending report from MD about hemoglobin levels for discharge.   Problem: Clinical Measurements: Goal: Will remain free from infection Outcome: Progressing   Problem: Pain Managment: Goal: General experience of comfort will improve Outcome: Progressing   Problem: Safety: Goal: Ability to remain free from injury will improve Outcome: Progressing   Problem: Skin Integrity: Goal: Risk for impaired skin integrity will decrease Outcome: Progressing

## 2019-10-30 NOTE — Plan of Care (Addendum)
Pt discharging home. Packed all personal belongings and returned to pt. Discharge instructions explained to pt and verbalized understanding. No further questions or concerns voiced. Pt's ride has arrived.   Problem: Education: Goal: Knowledge of General Education information will improve Description: Including pain rating scale, medication(s)/side effects and non-pharmacologic comfort measures 10/30/2019 1349 by Stevan Born, RN Outcome: Completed/Met   Problem: Health Behavior/Discharge Planning: Goal: Ability to manage health-related needs will improve 10/30/2019 1349 by Jayziah Bankhead, Curley Spice, RN Outcome: Completed/Met  Problem: Clinical Measurements: Goal: Ability to maintain clinical measurements within normal limits will improve Outcome: Completed/Met Goal: Will remain free from infection 10/30/2019 1349 by Julion Gatt, Curley Spice, RN Outcome: Completed/Met Goal: Diagnostic test results will improve Outcome: Completed/Met Goal: Respiratory complications will improve Outcome: Completed/Met Goal: Cardiovascular complication will be avoided Outcome: Completed/Met   Problem: Activity: Goal: Risk for activity intolerance will decrease Outcome: Completed/Met   Problem: Nutrition: Goal: Adequate nutrition will be maintained Outcome: Completed/Met   Problem: Coping: Goal: Level of anxiety will decrease Outcome: Completed/Met   Problem: Elimination: Goal: Will not experience complications related to bowel motility Outcome: Completed/Met Goal: Will not experience complications related to urinary retention Outcome: Completed/Met   Problem: Pain Managment: Goal: General experience of comfort will improve 10/30/2019 1349 by Oyuki Hogan, Curley Spice, RN Outcome: Completed/Met    Problem: Safety: Goal: Ability to remain free from injury will improve 10/30/2019 1349 by Toniqua Melamed, Curley Spice, RN Outcome: Completed/Met    Problem: Skin Integrity: Goal: Risk for impaired skin integrity will  decrease 10/30/2019 1349 by Donalyn Schneeberger, Curley Spice, RN Outcome: Completed/Met

## 2019-10-30 NOTE — Discharge Summary (Signed)
Physician Discharge Summary  Patient ID: Melanie [name] MRN: [id number] DOB/AGE: 38-Jul-1982 38 y.o.  Admit date: 10/24/2019 Discharge date: 10/30/2019  Admission Diagnoses: Perforated appendicitis  Discharge Diagnoses:  Active Problems:   Perforated appendicitis   Discharged Condition: stable  Hospital Course:  Pt was admitted with perforated appendicitis without abscess. She went to the OR 11/24 and had lap converted to open surgery with anticipated appendectomy being converted to an open ileocecectomy.  A provena wound vac was placed on the incision at the time of surgery.  She did relatively well post op with the exception of several bloody stools and drop in HCT down to 25 but came up to 26.9 prior to discharge.  She had good mobility and pain control.  A JP was placed at the time of surgery, but given low output and serous nature, it was pulled prior to d/c.  Wound vac was also removed without evidence of infection underneath.  She is tolerating a soft diet and oral pain meds.  She has been afebrile.     Consults: None  Significant Diagnostic Studies: labs: see above  Treatments: surgery: see above.    Discharge Exam: Blood pressure (!) 141/90, pulse 79, temperature 98.4 F (36.9 C), temperature source Oral, resp. rate 16, height 5\' 10"  (1.778 m), weight 108.9 kg, last menstrual period 10/17/2019, SpO2 100 %. General appearance: alert, cooperative and no distress Resp: breathing comfortably GI: sfot, non distended, non tender.  no wound drainage.  staples deliberately far apart.   Extremities: extremities normal, atraumatic, no cyanosis or edema  Disposition:    Allergies as of 10/30/2019   No Known Allergies     Medication List    TAKE these medications   acetaminophen 325 MG tablet Commonly known as: TYLENOL Take 2 tablets (650 mg total) by mouth every 6 (six) hours.   amoxicillin-clavulanate 875-125 MG tablet Commonly known as: AUGMENTIN Take 1 tablet by  mouth every 12 (twelve) hours.   hydrochlorothiazide 12.5 MG tablet Commonly known as: HYDRODIURIL Take 12.5 mg by mouth daily.   methocarbamol 500 MG tablet Commonly known as: ROBAXIN Take 1 tablet (500 mg total) by mouth 4 (four) times daily.   naproxen sodium 220 MG tablet Commonly known as: ALEVE Take 440 mg by mouth 2 (two) times daily as needed (pain).   oxyCODONE 5 MG immediate release tablet Commonly known as: Oxy IR/ROXICODONE Take 1-2 tablets (5-10 mg total) by mouth every 4 (four) hours as needed for moderate pain or severe pain (5mg  for moderate pain, 10mg  for severe pain).      Follow-up [facility] Follow up in 1 week(s).   Specialty: General Surgery Why: staple removal Contact information: [place] [phone]       Clovis Riley, MD Follow up in 2 week(s).   Specialty: General Surgery Contact information: [address] [place] [address] [phone]           Signed: Stark Klein 10/30/2019, 12:17 PM

## 2019-10-30 NOTE — Progress Notes (Signed)
5 Days Post-Op  Subjective: No additional bloody bowel movements.  Tolerating diet.  Eager to go home.    Objective: Vital signs in last 24 hours: Temp:  [98.4 F (36.9 C)-98.6 F (37 C)] 98.4 F (36.9 C) (11/29 0925) Pulse Rate:  [79-91] 79 (11/29 0925) Resp:  [16-17] 16 (11/29 0428) BP: (137-141)/(83-90) 141/90 (11/29 0925) SpO2:  [99 %-100 %] 100 % (11/29 0925) Last BM Date: 10/29/19  Intake/Output from previous day: 11/28 0701 - 11/29 0700 In: 120 [P.O.:120] Out: 1020 [Urine:1000; Drains:18; Stool:2] Intake/Output this shift: No intake/output data recorded.  PE: Gen:  A&o x 3. NAD Resp:  Breathing comfortably Abd: soft, non distended, non tender.  Wound vac removed.  No purulent drainage.  Jp serous.    Lab Results:  Recent Labs    10/29/19 0350 10/30/19 0230  WBC 12.7* 13.7*  HGB 8.1* 8.7*  HCT 25.3* 26.9*  PLT 201 226   BMET Recent Labs    10/29/19 0350  NA 138  K 3.7  CL 107  CO2 22  GLUCOSE 106*  BUN 6  CREATININE 0.75  CALCIUM 8.2*   PT/INR No results for input(s): LABPROT, INR in the last 72 hours. CMP     Component Value Date/Time   NA 138 10/29/2019 0350   K 3.7 10/29/2019 0350   CL 107 10/29/2019 0350   CO2 22 10/29/2019 0350   GLUCOSE 106 (H) 10/29/2019 0350   BUN 6 10/29/2019 0350   CREATININE 0.75 10/29/2019 0350   CALCIUM 8.2 (L) 10/29/2019 0350   PROT 8.4 (H) 10/24/2019 1626   ALBUMIN 3.7 10/24/2019 1626   AST 19 10/24/2019 1626   ALT 13 10/24/2019 1626   ALKPHOS 52 10/24/2019 1626   BILITOT 1.7 (H) 10/24/2019 1626   GFRNONAA >60 10/29/2019 0350   GFRAA >60 10/29/2019 0350   Lipase     Component Value Date/Time   LIPASE 21 10/24/2019 1626       Studies/Results: No results found.  Anti-infectives: Anti-infectives (From admission, onward)   Start     Dose/Rate Route Frequency Ordered Stop   10/30/19 0000  amoxicillin-clavulanate (AUGMENTIN) 875-125 MG tablet     1 tablet Oral Every 12 hours 10/30/19 1200      10/29/19 1030  amoxicillin-clavulanate (AUGMENTIN) 875-125 MG per tablet 1 tablet     1 tablet Oral Every 12 hours 10/29/19 1021     10/25/19 0500  piperacillin-tazobactam (ZOSYN) IVPB 3.375 g  Status:  Discontinued     3.375 g 12.5 mL/hr over 240 Minutes Intravenous Every 8 hours 10/24/19 2358 10/29/19 1021   10/24/19 2130  piperacillin-tazobactam (ZOSYN) IVPB 3.375 g     3.375 g 100 mL/hr over 30 Minutes Intravenous  Once 10/24/19 2129 10/24/19 2255       Assessment/Plan HTN - restart home meds Acute kidney injury- Resolved Sepsis-Improved, VSS ABLA- Hgb 9 from 9, hold lovenox  POD 5, s/plaparoscopic appendectomy converted to open ileocecectomy, Dr. Fredricka Bonine, for acute perforated appendicitis with diffuse purulent peritonitis -Prevena Incisional Wound VAC removed.  No evidence of infection of wound.   -Bloody BMs have stopped.  Stable ABL anemia Diet as tolerated.  D/c JP PO antibiotics x 1 week.    QAS:TMHDQQI diet  VTE: SCD's, lovenox WL:NLGXQ 11/24>>11/28WBC down to 12.7, Augmentin 11/28 >> Foley:D/c POD #2 Follow up:Dr. Fredricka Bonine DISPO:home today.     LOS: 6 days     Maudry Diego, MD [facility] Surgical Oncology, General Surgery, Trauma and Critical Care  [facility], [place] for weekday/non holidays Check [email or web address] for coverage night/weekend/holidays  Do not use SecureChat as we are not always in the epic system to get the message.  Also, we may be off.  It is not reliable for patient care.

## 2021-03-07 ENCOUNTER — Other Ambulatory Visit: Payer: Self-pay

## 2021-03-07 ENCOUNTER — Emergency Department
Admission: EM | Admit: 2021-03-07 | Discharge: 2021-03-07 | Disposition: A | Discharge status: Home / Self Care | Payer: Federal, State, Local not specified - PPO | Source: Home / Self Care | Attending: Emergency Medicine | Admitting: Emergency Medicine

## 2021-03-07 DIAGNOSIS — H9201 Otalgia, right ear: Secondary | ICD-10-CM

## 2021-03-07 MED ORDER — CYCLOBENZAPRINE HCL 10 MG PO TABS: 10.0000 mg | ORAL_TABLET | Freq: Every day | ORAL | 0 refills | Status: AC

## 2021-03-07 MED ORDER — IBUPROFEN 600 MG PO TABS: 600.0000 mg | ORAL_TABLET | Freq: Four times a day (QID) | ORAL | 0 refills | Status: AC | PRN

## 2021-03-07 NOTE — ED Triage Notes (Signed)
Pt c/o RT ear pain since yesterday. Denies fever or any other sxs. Pain 8/10 Advil prn.

## 2021-03-07 NOTE — Discharge Instructions (Addendum)
Try 600 mg of ibuprofen combined with 1000 mg of Tylenol together 3-4 times a day.  Soft diet, Flexeril at night.  This could be early shingles. Return here or see your doctor if you get a rash on your face.

## 2021-03-07 NOTE — ED Provider Notes (Signed)
HPI  SUBJECTIVE:  Melanie [name] is a 40 y.o. female who presents with sharp, constant right ear pain starting yesterday that radiates anteriorally over her forehead.  No fevers, change in hearing, otorrhea, recent URI symptoms.  No allergy symptoms.  No foreign body insertion.  No recent swimming.  She does not grind her teeth.  No popping or clicking of the jaw.  No rash in the area of pain, eye pain.  No antibiotics in the past month.  No Antipyretic in the past 6 hours.  She tried 400 mg of Advil once with improvement in her symptoms.  There are no aggravating factors.  It is not associated with chewing, yawning, brushing her teeth.  She has a past medical history of frequent otitis media and varicella.  No history of diabetes, TMJ arthralgia, trigeminal neuralgia.  LMP: 4/2.  Denies the possibility of being pregnant.  WHQ:PRFFMBW, Melanie [name]   Past Medical History:  Diagnosis Date  . Anemia    hx  . Chicken pox chicken pox  . Fracture of arm    right  . Heart murmur    no problems or tests  . Ovarian cyst     Past Surgical History:  Procedure Laterality Date  . APPENDECTOMY N/A 10/25/2019   Procedure: OPEN APPENDECTOMY;  Surgeon: Berna Bue, MD;  Location: [facility] OR;  Service: General;  Laterality: N/A;  . CESAREAN SECTION N/A 06/16/2013   Procedure: CESAREAN SECTION with left side tubal ligation;  Surgeon: [name] Bad, MD;  Location: [facility] ORS;  Service: Obstetrics;  Laterality: N/A;  left side tubal ligation, patient has no right side fallopian tube  . LAPAROSCOPIC APPENDECTOMY N/A 10/25/2019   Procedure: ATTEMPTED LAPAROSCOPIC APPENDECTOMY;  Surgeon: Berna Bue, MD;  Location: [facility] OR;  Service: General;  Laterality: N/A;  . ovarian cystectomy Right    40 yrs old    Family History  Problem Relation Age of Onset  . Hypertension Father   . Diabetes Paternal Grandmother   . Cancer Neg Hx   . Stroke Neg Hx     Social History   Tobacco Use  . Smoking status:  Never Smoker  . Smokeless tobacco: Never Used  Substance Use Topics  . Alcohol use: No  . Drug use: No    No current facility-administered medications for this encounter.  Current Outpatient Medications:  .  cyclobenzaprine (FLEXERIL) 10 MG tablet, Take 1 tablet (10 mg total) by mouth at bedtime., Disp: 20 tablet, Rfl: 0 .  ibuprofen (ADVIL) 600 MG tablet, Take 1 tablet (600 mg total) by mouth every 6 (six) hours as needed., Disp: 30 tablet, Rfl: 0 .  Diethylpropion HCl 25 MG TABS, Take 1 tablet by mouth 3 (three) times daily., Disp: , Rfl:  .  hydrochlorothiazide (HYDRODIURIL) 12.5 MG tablet, Take 12.5 mg by mouth daily., Disp: , Rfl:   Facility-Administered Medications Ordered in Other Encounters:  .  rho(d) immune globulin (RHIG/Rhophylac) injection 300 mcg, 300 mcg, Intramuscular, Once, Antionette Char, MD  No Known Allergies   ROS  As noted in HPI.   Physical Exam  BP 139/86 (BP Location: Right Arm)   Pulse 66   Temp 97.6 F (36.4 C) (Oral)   Resp 17   LMP 03/02/2021 (Exact Date)   SpO2 100%   Constitutional: Well developed, well nourished, no acute distress Eyes:  EOMI, conjunctiva normal bilaterally HENT: Normocephalic, atraumatic,mucus membranes moist.  Right external ear normal.  Right EAC normal.  Right TM normal.  No pain with traction on pinna, palpation of tragus, palpation of mastoid.  No tenderness over the TMJ.  No crepitus..  No tenderness in the V1 distribution. Respiratory: Normal inspiratory effort Cardiovascular: Normal rate GI: nondistended skin: No facial rash, skin intact Musculoskeletal: no deformities Neurologic: Alert & oriented x 3, no focal neuro deficits Psychiatric: Speech and behavior appropriate   ED Course   Medications - No data to display  No orders of the defined types were placed in this encounter.   No results found for this or any previous visit (from the past 24 hour(s)). No results found.  ED Clinical  Impression  1. Otalgia of right ear      ED Assessment/Plan  Patient with otalgia.  Does not appear to be external otitis, otitis media, TMJ arthralgia.  Could be early shingles.Discussed with patient that it is impossible make the diagnosis until the rash appears.  Could be trigeminal neuralgia.  Will treat symptomatically with Tylenol/ibuprofen, we can try some Flexeril at night and a soft diet for the next few days in case this is her TMJ.  She is to return here or see her doctor if her rash shows up and we can treat her for shingles at that time. Discussed , MDM, treatment plan, and plan for follow-up with patient. Discussed sn/sx that should prompt return to the ED. patient agrees with plan.   Meds ordered this encounter  Medications  . cyclobenzaprine (FLEXERIL) 10 MG tablet    Sig: Take 1 tablet (10 mg total) by mouth at bedtime.    Dispense:  20 tablet    Refill:  0  . ibuprofen (ADVIL) 600 MG tablet    Sig: Take 1 tablet (600 mg total) by mouth every 6 (six) hours as needed.    Dispense:  30 tablet    Refill:  0      *This clinic note was created using Scientist, clinical (histocompatibility and immunogenetics). Therefore, there may be occasional mistakes despite careful proofreading.  ?    Domenick Gong, MD 03/08/21 [id number]

## 2021-05-05 IMAGING — CT CT ABD-PELV W/ CM
2 of 4 series · 15 of 46 positions shown, 17 images · IV contrast (Omni 300)
Comparison: None.

CLINICAL DATA: Right lower quadrant pain

EXAM:
CT ABDOMEN AND PELVIS WITH CONTRAST
TECHNIQUE: Multidetector CT imaging of the abdomen and pelvis was performed
using the standard protocol following bolus administration of
intravenous contrast.
CONTRAST:  100mL OMNIPAQUE IOHEXOL 300 MG/ML  SOLN

[Series 3: a/p w/ 5mm · axial · 0.78mm/px · z∈[-528,-88]mm · 12 of 104 slices shown, 14 images]
[im 8/104  soft-tissue]
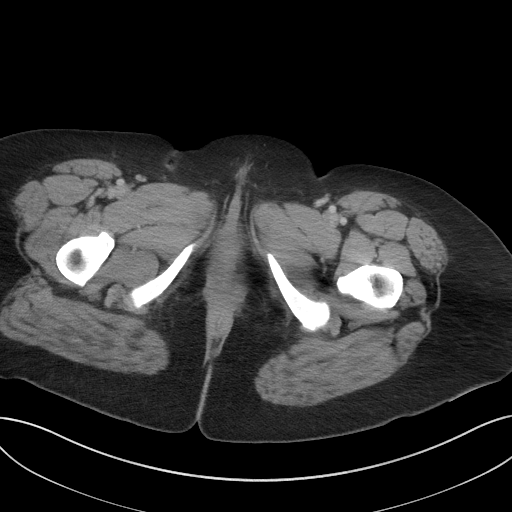
[im 8/104  bone]
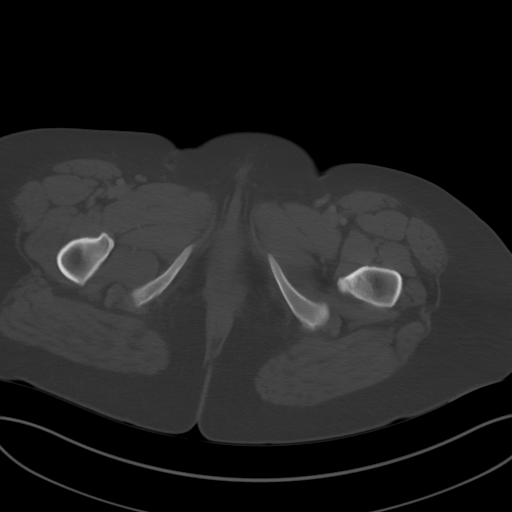
[im 16/104  soft-tissue]
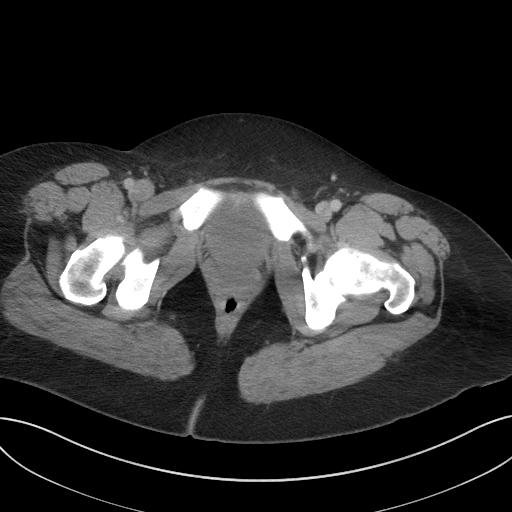
[im 24/104  soft-tissue]
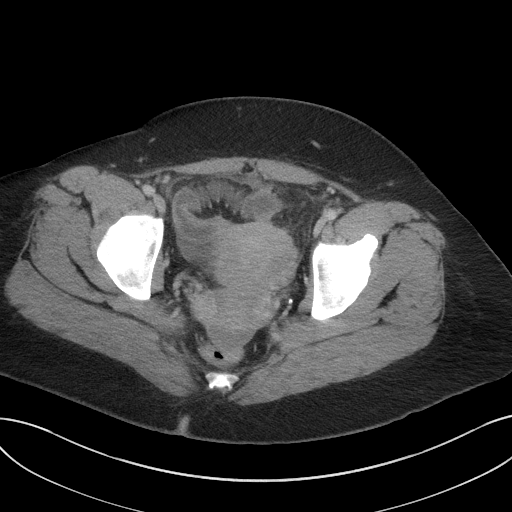
[im 32/104  soft-tissue]
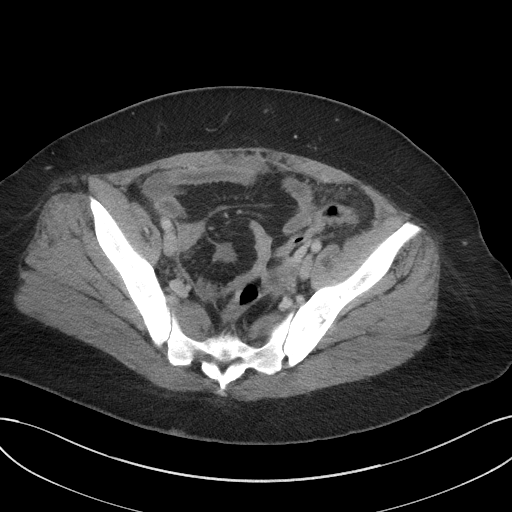
[im 40/104  soft-tissue]
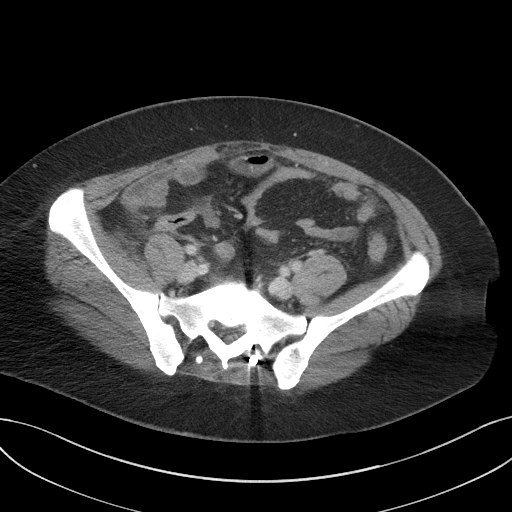
[im 48/104  soft-tissue]
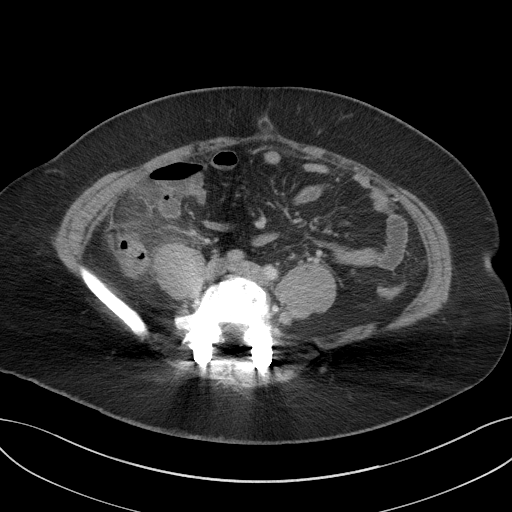
[im 56/104  soft-tissue]
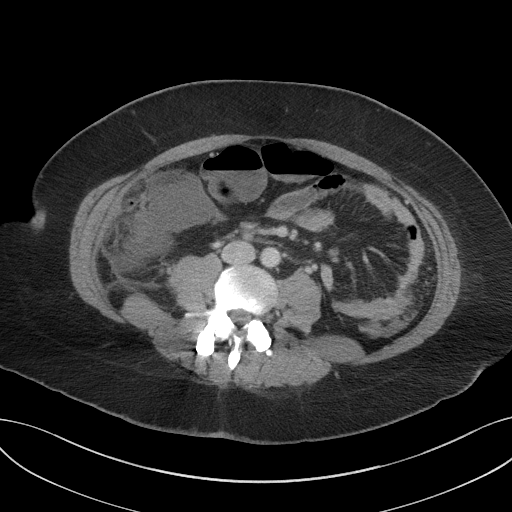
[im 64/104  soft-tissue]
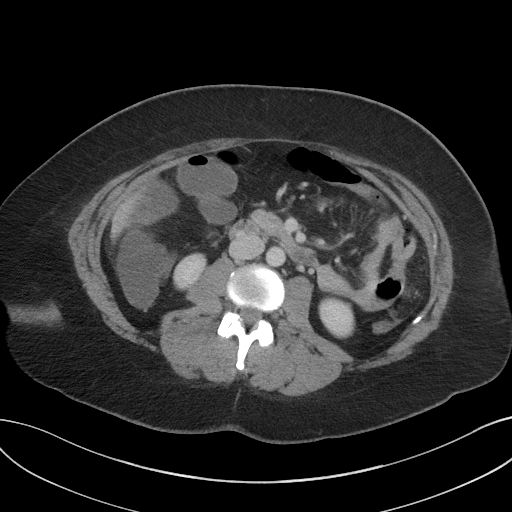
[im 72/104  soft-tissue]
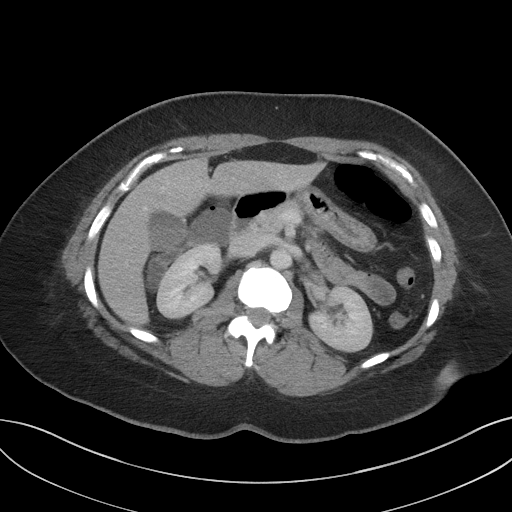
[im 72/104  bone]
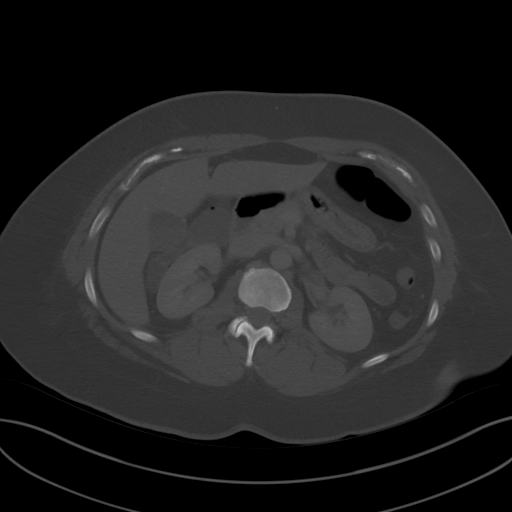
[im 80/104  soft-tissue]
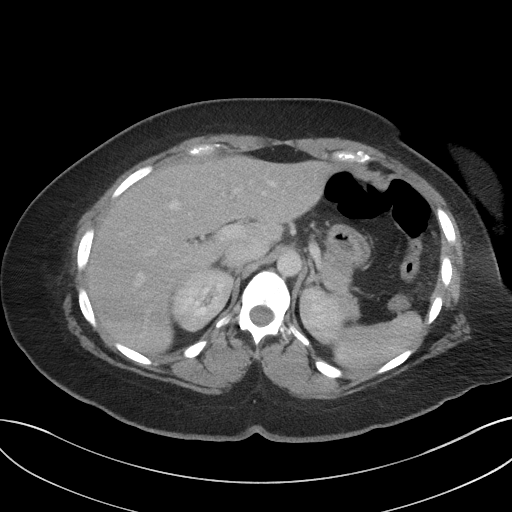
[im 88/104  soft-tissue]
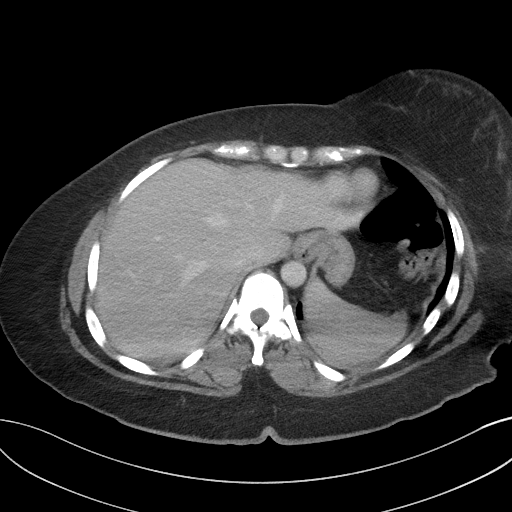
[im 96/104  soft-tissue]
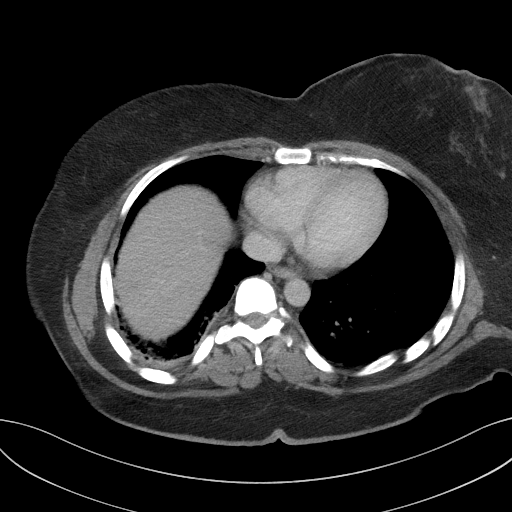

[Series 6: a/p w/ cor · coronal · 0.86mm/px · 3 of 151 slices shown]
[im 51/151  soft-tissue]
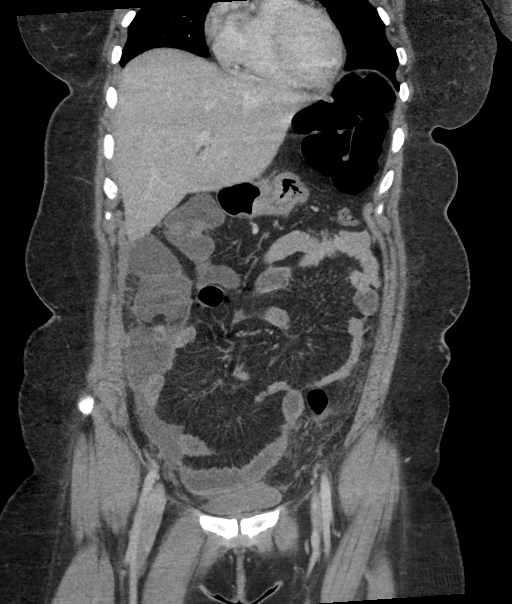
[im 67/151  soft-tissue]
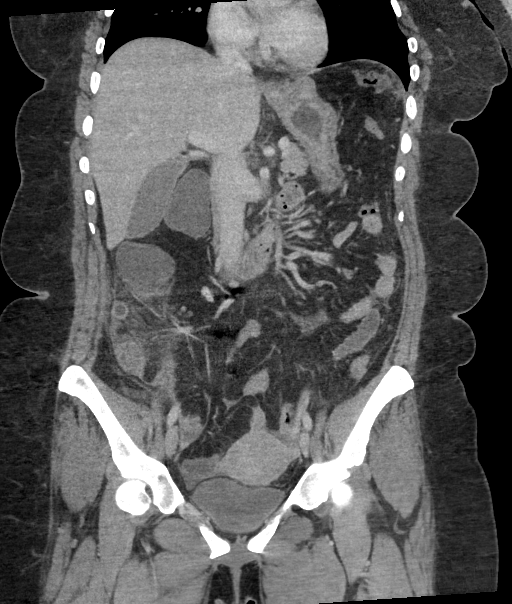
[im 84/151  soft-tissue]
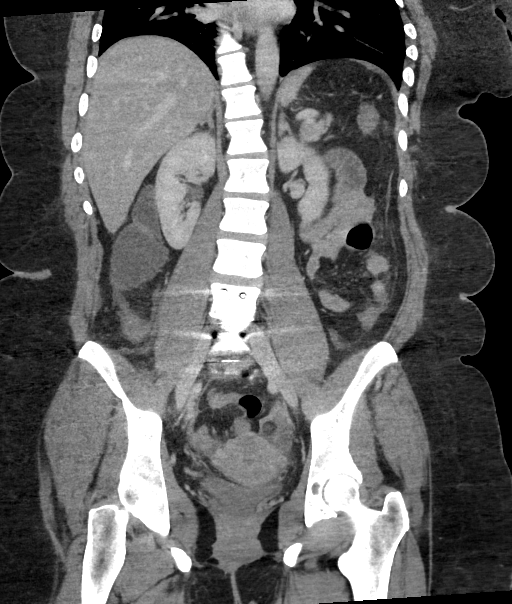

[15 of 46 positions shown; findings below may reference images not displayed]

FINDINGS: Lower chest: Lung bases demonstrate patchy atelectasis at the right
base. No consolidation or effusion. The heart size is normal.

Hepatobiliary: Subcentimeter hypodensity in the posterior right
hepatic lobe too small to further characterize. Possible sludge in
the gallbladder. No calcified gallstone or biliary dilatation

Pancreas: Unremarkable. No pancreatic ductal dilatation or
surrounding inflammatory changes.

Spleen: Normal in size without focal abnormality.

Adrenals/Urinary Tract: Adrenal glands are unremarkable. Kidneys are
normal, without renal calculi, focal lesion, or hydronephrosis.
Bladder is unremarkable.

Stomach/Bowel: The stomach is nonenlarged. No dilated small bowel.
Fluid filled right colon. Abnormal appendix in the right lower
quadrant measuring up to 17 mm with mucosal enhancement.
Considerable right lower quadrant inflammatory changes. Small gas
and fluid collection adjacent to the appendix. Slightly thickened
but nondistended small bowel in the pelvis with mucosal enhancement.

Vascular/Lymphatic: Nonaneurysmal aorta. Small right lower quadrant
lymph nodes

Reproductive: Uterus and bilateral adnexa are unremarkable.

Other: Slightly complex fluid in the posterior pelvis, without
well-defined rim. Additional foci of fluid within the pelvis and
interspersed among small bowel. Mild soft tissue stranding within
the mesentery.

Musculoskeletal: Posterior rods and fixating screws L4 through S1.
IMPRESSION: 1. Findings are suspicious for an acute perforated appendicitis. No
organized abscess at this time, however multiple small fluid
collections are noted within the lower abdomen and pelvis, posterior
pelvic collection is slightly complex. Mild generalized soft tissue
stranding in the mesentery consistent with generalized
inflammation/peritonitis.

Appendix: Location: Retrocecal, right lower quadrant

Diameter: 17 mm

Appendicolith: Not seen

Mucosal hyper-enhancement: Present

Extraluminal gas: Present

Periappendiceal collection: Small amount of fluid and gas adjacent
to the appendix without well-formed abscess. Additional small fluid
collections within the lower abdomen and pelvis

2. Possible increased intraluminal density within the gallbladder as
may be seen with sludge

Critical Value/emergent results were called by telephone at the time
of interpretation on 10/24/2019 at [DATE] to Puma Lamont ,
who verbally acknowledged these results.

## 2024-10-31 ENCOUNTER — Emergency Department (HOSPITAL_COMMUNITY)
Admission: EM | Admit: 2024-10-31 | Discharge: 2024-10-31 | Disposition: A | Discharge status: Home / Self Care | Attending: Emergency Medicine | Admitting: Emergency Medicine

## 2024-10-31 ENCOUNTER — Emergency Department (HOSPITAL_COMMUNITY)

## 2024-10-31 ENCOUNTER — Other Ambulatory Visit: Payer: Self-pay

## 2024-10-31 DIAGNOSIS — R7989 Other specified abnormal findings of blood chemistry: Secondary | ICD-10-CM | POA: Insufficient documentation

## 2024-10-31 DIAGNOSIS — Z79899 Other long term (current) drug therapy: Secondary | ICD-10-CM | POA: Diagnosis not present

## 2024-10-31 DIAGNOSIS — D649 Anemia, unspecified: Secondary | ICD-10-CM | POA: Diagnosis not present

## 2024-10-31 DIAGNOSIS — I10 Essential (primary) hypertension: Secondary | ICD-10-CM | POA: Diagnosis not present

## 2024-10-31 DIAGNOSIS — R0789 Other chest pain: Secondary | ICD-10-CM | POA: Insufficient documentation

## 2024-10-31 LAB — BASIC METABOLIC PANEL WITH GFR
Anion gap: 9 (ref 5–15)
BUN: 19 mg/dL (ref 6–20)
CO2: 26 mmol/L (ref 22–32)
Calcium: 9.3 mg/dL (ref 8.9–10.3)
Chloride: 103 mmol/L (ref 98–111)
Creatinine, Ser: 1.06 mg/dL — ABNORMAL HIGH (ref 0.44–1.00)
GFR, Estimated: >60 mL/min (ref >60)
Glucose, Bld: 100 mg/dL — ABNORMAL HIGH (ref 70–99)
Potassium: 3.9 mmol/L (ref 3.5–5.1)
Sodium: 138 mmol/L (ref 135–145)

## 2024-10-31 LAB — CBC
HCT: 33.7 % — ABNORMAL LOW (ref 36.0–46.0)
Hemoglobin: 10.6 g/dL — ABNORMAL LOW (ref 12.0–15.0)
MCH: 24.8 pg — ABNORMAL LOW (ref 26.0–34.0)
MCHC: 31.5 g/dL (ref 30.0–36.0)
MCV: 78.9 fL — ABNORMAL LOW (ref 80.0–100.0)
Platelets: 271 K/uL (ref 150–400)
RBC: 4.27 MIL/uL (ref 3.87–5.11)
RDW: 17.5 % — ABNORMAL HIGH (ref 11.5–15.5)
WBC: 5.9 K/uL (ref 4.0–10.5)
nRBC: 0 % (ref 0.0–0.2)

## 2024-10-31 LAB — HCG, SERUM, QUALITATIVE: Preg, Serum: NEGATIVE

## 2024-10-31 LAB — TROPONIN T, HIGH SENSITIVITY: Troponin T High Sensitivity: <15 ng/L (ref 0–19)

## 2024-10-31 MED ORDER — HYDROCHLOROTHIAZIDE 12.5 MG PO TABS
12.5000 mg | ORAL_TABLET | Freq: Once | ORAL | Status: AC
  Administered 2024-10-31: 12.5 mg via ORAL
  Filled 2024-10-31: qty 1

## 2024-10-31 NOTE — ED Provider Notes (Signed)
 [place] EMERGENCY DEPARTMENT AT [facility] Provider Note   CSN: [id number] Arrival date & time: 10/31/24  9273     Patient presents with: Chest Pain   Melanie [name] is a 43 y.o. female with past medical history significant for obesity, previous appendicitis who presents with concern for chest pain that began on Saturday.  She reports the pain worsened yesterday and began to radiate across her back.  She denies any shortness of breath, she reports that she has not taken her high blood pressure medication today.  Patient reports that she did start working out last week which is new for her, she does endorse some left-sided chest soreness, but that the chest pain which is intermittent in nature does not feel like muscular soreness or spasm.  She reports that it is quite sharp, on Saturday when it started it was more subtle, less severe, but yesterday she reports that she had some 10/10 pain.  She has not tried anything for the pain at home.  She denies any nausea, vomiting.  She reports it wraps around from her left chest to her scapula.  No recent fever, chills.  She endorses history of high blood pressure, high cholesterol, okay denies history of diabetes, reports that she does have a family history of early cardiac disease, father had a heart attack around 81 or 19.    Chest Pain      Prior to Admission medications   Medication Sig Start Date End Date Taking? Authorizing Provider  cyclobenzaprine  (FLEXERIL ) 10 MG tablet Take 1 tablet (10 mg total) by mouth at bedtime. 03/07/21   Mortenson, Ashley, MD  Diethylpropion HCl 25 MG TABS Take 1 tablet by mouth 3 (three) times daily. 02/14/21   [provider]  hydrochlorothiazide  (HYDRODIURIL ) 12.5 MG tablet Take 12.5 mg by mouth daily. 08/10/19   [provider]  ibuprofen  (ADVIL ) 600 MG tablet Take 1 tablet (600 mg total) by mouth every 6 (six) hours as needed. 03/07/21   Mortenson, Ashley, MD    Allergies: Patient  has no known allergies.    Review of Systems  Cardiovascular:  Positive for chest pain.  All other systems reviewed and are negative.   Updated Vital Signs BP 121/81   Pulse 69   Temp 98.3 F (36.8 C) (Oral)   Resp 17   Ht 5' 10 (1.778 m)   Wt 87.5 kg   LMP 10/10/2024 (Approximate)   SpO2 100%   BMI 27.69 kg/m   Physical Exam Vitals and nursing note reviewed.  Constitutional:      General: She is not in acute distress.    Appearance: Normal appearance.  HENT:     Head: Normocephalic and atraumatic.  Eyes:     General:        Right eye: No discharge.        Left eye: No discharge.  Cardiovascular:     Rate and Rhythm: Normal rate and regular rhythm.     Heart sounds: No murmur heard.    No friction rub. No gallop.  Pulmonary:     Effort: Pulmonary effort is normal.     Breath sounds: Normal breath sounds.     Comments: No wheezing, rhonchi, stridor, rales Chest:     Comments: Mild ttp of chest wall on left Abdominal:     General: Bowel sounds are normal.     Palpations: Abdomen is soft.  Skin:    General: Skin is warm and dry.  Capillary Refill: Capillary refill takes less than 2 seconds.  Neurological:     Mental Status: She is alert and oriented to person, place, and time.  Psychiatric:        Mood and Affect: Mood normal.        Behavior: Behavior normal.     (all labs ordered are listed, but only abnormal results are displayed) Labs Reviewed  BASIC METABOLIC PANEL WITH GFR - Abnormal; Notable for the following components:      Result Value   Glucose, Bld 100 (*)    Creatinine, Ser 1.06 (*)    All other components within normal limits  CBC - Abnormal; Notable for the following components:   Hemoglobin 10.6 (*)    HCT 33.7 (*)    MCV 78.9 (*)    MCH 24.8 (*)    RDW 17.5 (*)    All other components within normal limits  HCG, SERUM, QUALITATIVE  TROPONIN T, HIGH SENSITIVITY    EKG: None  Radiology: DG Chest 2 View Result Date:  10/31/2024 CLINICAL DATA:  chest pain EXAM: CHEST - 2 VIEW COMPARISON:  None available. FINDINGS: No focal airspace consolidation, pleural effusion, or pneumothorax. No cardiomegaly.No acute fracture or destructive lesion. Dextrocurvature of the thoracic spine. IMPRESSION: No acute cardiopulmonary abnormality. Electronically Signed   By: Rogelia Myers M.D.   On: 10/31/2024 08:24     Procedures   Medications Ordered in the ED  hydrochlorothiazide  (HYDRODIURIL ) tablet 12.5 mg (12.5 mg Oral Given 10/31/24 9188)                                    Medical Decision Making Amount and/or Complexity of Data Reviewed Labs: ordered. Radiology: ordered.   This patient is a 43 y.o. female  who presents to the ED for concern of chest pain.   Differential diagnoses prior to evaluation: The emergent differential diagnosis includes, but is not limited to,  ACS, AAS, PE, Mallory-Weiss, Boerhaave's, Pneumonia, acute bronchitis, asthma or COPD exacerbation, anxiety, MSK pain or traumatic injury to the chest, acid reflux versus other . This is not an exhaustive differential.   Past Medical History / Co-morbidities / Social History: Obesity, hypertension, hld  Physical Exam: Physical exam performed. The pertinent findings include: Hypertensive on ED arrival, blood pressure 168/92, vital signs otherwise stable.  Some tenderness to palpation of left-sided chest wall, normal heart and lung sounds throughout.  Patient  Lab Tests/Imaging studies: I personally interpreted labs/imaging and the pertinent results include: CBC notable for mild anemia, hemoglobin 10.6, otherwise unremarkable, no leukocytosis.  Plain film chest x-ray with no evidence of acute intrathoracic abnormality.  BMP overall unremarkable, mildly elevated creatinine at 1.06 although has been elevated to similar levels in the past.  Negative troponin x 1 in context of no active chest pain, chest pain ongoing for several days.  Negative serum  pregnancy test.  I agree with the radiologist interpretation.  Cardiac monitoring: EKG obtained and interpreted by myself and attending physician which shows: Normal sinus rhythm, no acute ST-T changes   Medications: I ordered medication including hydrochlorothiazide  since patient did not take her blood pressure medications today, repeat blood pressure 121/81..  I have reviewed the patients home medicines and have made adjustments as needed.   Disposition: After consideration of the diagnostic results and the patients response to treatment, I feel that patient stable for discharge, suspect musculoskeletal chest pain, patient understands and  agrees to plan, given borderline elevated kidney function I encouraged her to follow-up with her primary care doctor to discuss and recheck her creatinine, encourage plenty of fluids, patient understands and agrees to plan.  Encouraged ibuprofen , Tylenol  if chest pain does return, however if more severe, persistent recommend return to the ED for further evaluation.   emergency department workup does not suggest an emergent condition requiring admission or immediate intervention beyond what has been performed at this time. The plan is: as above. The patient is safe for discharge and has been instructed to return immediately for worsening symptoms, change in symptoms or any other concerns.   Final diagnoses:  Chest wall pain    ED Discharge Orders     None          Rosan Sherlean DEL, PA-C 10/31/24 0853    Mannie Pac T, DO 11/01/24 0740

## 2024-10-31 NOTE — ED Triage Notes (Signed)
 Pt ambulatory from triage to room 10 with complaints of chest pain that began on Saturday. Pt states that yesterday her pain worsened and began to radiate across her back. Pt denies shortness of breath.   PT endorses a hx of HTN, and has NOT had her meds yet today.

## 2024-10-31 NOTE — Discharge Instructions (Signed)
 Please use Tylenol  or ibuprofen  for pain.  You may use 600 mg ibuprofen  every 6 hours or 1000 mg of Tylenol  every 6 hours.  You may choose to alternate between the 2.  This would be most effective.  Not to exceed 4 g of Tylenol  within 24 hours.  Not to exceed 3200 mg ibuprofen  24 hours.  If you have significant worsening or returning chest pain despite treatment as above or recommend returning for further evaluation in the emergency department.  As we discussed your creatinine was just very mildly elevated, normal ranges around 1 or less, yours was 1.06 today.  Make sure that you are drinking plenty of fluids and follow-up with your primary care doctor for close recheck of this value.
